# Patient Record
Sex: Female | Born: 1985 | Race: White | Hispanic: No
Health system: Southern US, Community
[De-identification: ages and names within clinical notes are randomized; demographics above are authoritative.]

## PROBLEM LIST (undated history)

## (undated) DIAGNOSIS — O1490 Unspecified pre-eclampsia, unspecified trimester: Secondary | ICD-10-CM

## (undated) DIAGNOSIS — O139 Gestational [pregnancy-induced] hypertension without significant proteinuria, unspecified trimester: Secondary | ICD-10-CM

## (undated) DIAGNOSIS — F329 Major depressive disorder, single episode, unspecified: Secondary | ICD-10-CM

## (undated) DIAGNOSIS — F32A Depression, unspecified: Secondary | ICD-10-CM

## (undated) DIAGNOSIS — F419 Anxiety disorder, unspecified: Secondary | ICD-10-CM

## (undated) DIAGNOSIS — R519 Headache, unspecified: Secondary | ICD-10-CM

## (undated) HISTORY — PX: WISDOM TOOTH EXTRACTION: SHX21

## (undated) HISTORY — PX: GANGLION CYST EXCISION: SHX1691

## (undated) HISTORY — DX: Unspecified pre-eclampsia, unspecified trimester: O14.90

## (undated) HISTORY — DX: Headache, unspecified: R51.9

---
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
## 2010-02-18 ENCOUNTER — Ambulatory Visit: Payer: Self-pay | Admitting: Family Medicine

## 2010-02-18 DIAGNOSIS — F329 Major depressive disorder, single episode, unspecified: Secondary | ICD-10-CM | POA: Insufficient documentation

## 2010-02-18 DIAGNOSIS — N209 Urinary calculus, unspecified: Secondary | ICD-10-CM | POA: Insufficient documentation

## 2010-02-18 DIAGNOSIS — F411 Generalized anxiety disorder: Secondary | ICD-10-CM | POA: Insufficient documentation

## 2010-02-18 LAB — CONVERTED CEMR LAB
Beta hcg, urine, semiquantitative: NEGATIVE
Bilirubin Urine: NEGATIVE
Glucose, Urine, Semiquant: NEGATIVE
Ketones, urine, test strip: NEGATIVE
WBC Urine, dipstick: NEGATIVE

## 2010-10-29 ENCOUNTER — Emergency Department (HOSPITAL_BASED_OUTPATIENT_CLINIC_OR_DEPARTMENT_OTHER)
Admission: EM | Admit: 2010-10-29 | Discharge: 2010-10-29 | Payer: Self-pay | Source: Home / Self Care | Admitting: Emergency Medicine

## 2010-12-12 NOTE — Assessment & Plan Note (Signed)
Summary: Frequent, painful urination x this am rm 3   Vital Signs:  Patient Profile:   25 Years Old Female CC:      Frequent, painful urination - burning x this am LMP:     01/28/2010 Height:     66 inches Weight:      134 pounds O2 Sat:      100 % O2 treatment:    Room Air Pulse rhythm:   regular Resp:     16 per minute (right arm) Cuff size:   regular  Vitals Entered By: Areta Haber CMA (February 18, 2010 10:38 AM)  Menstrual History: LMP (date): 01/28/2010 LMP - Character: normal                  Current Allergies: No known allergies History of Present Illness History from: patient Reason for visit: uti sx today Chief Complaint: Frequent, painful urination - burning x this am History of Present Illness: abrupt onse of left flank pain,couldn't get comfortable,assoc urinary urgency, sl nausea, h/o kidney stone,never documented or followed up, no urologist.  REVIEW OF SYSTEMS Constitutional Symptoms      Denies fever, chills, night sweats, weight loss, weight gain, and fatigue.  Eyes       Denies change in vision, eye pain, eye discharge, glasses, contact lenses, and eye surgery. Ear/Nose/Throat/Mouth       Denies hearing loss/aids, change in hearing, ear pain, ear discharge, dizziness, frequent runny nose, frequent nose bleeds, sinus problems, sore throat, hoarseness, and tooth pain or bleeding.  Respiratory       Denies dry cough, productive cough, wheezing, shortness of breath, asthma, bronchitis, and emphysema/COPD.  Cardiovascular       Denies murmurs, chest pain, and tires easily with exhertion.    Gastrointestinal       Denies stomach pain, nausea/vomiting, diarrhea, constipation, blood in bowel movements, and indigestion. Genitourniary       Complains of painful urination.      Denies kidney stones and loss of urinary control.      Comments: Frequent, burning x this am Neurological       Denies paralysis, seizures, and  fainting/blackouts. Musculoskeletal       Denies muscle pain, joint pain, joint stiffness, decreased range of motion, redness, swelling, muscle weakness, and gout.  Skin       Denies bruising, unusual mles/lumps or sores, and hair/skin or nail changes.  Psych       Denies mood changes, temper/anger issues, anxiety/stress, speech problems, depression, and sleep problems. Other Comments: Pt states that she has Hx of UTIs. pt states she a old prescription of  Macrobid 100mg  1 tab po at night that she has used.   Past History:  h/o uti and possibly kidney stone several yrs ago  Past Medical History: Anxiety Depression  Past Surgical History: Wisdom teeth removed Removal cyst on hand PMH-FH-SH reviewed-no changes except otherwise noted  Family History: Family History Diabetes 1st degree relative Family History Hypertension Family History Lung cancer  Social History: Married Never Smoked Alcohol use-no Drug use-no Regular exercise-yes Smoking Status:  never Does Patient Exercise:  yes Drug Use:  no Physical Exam General appearance: well developed, well nourished, no acute distress Head: normocephalic, atraumatic Eyes: conjunctivae and lids normal Chest/Lungs: no rales, wheezes, or rhonchi bilateral, breath sounds equal without effort Heart: regular rate and  rhythm, no murmur Abdomen: soft bs nl, sl left cva and suprapubic soreness, no masses or guarding Assessment New Problems: STONE, URINARY  CALCULUS,UNSPEC. (ICD-592.9) FAMILY HISTORY DIABETES 1ST DEGREE RELATIVE (ICD-V18.0) DEPRESSION (ICD-311) ANXIETY (ICD-300.00)   Patient Education: Patient and/or caregiver instructed in the following: rest fluids and Tylenol.  Plan New Medications/Changes: TYLOX CAPS 1 q6h as needed pain  #15 fifteen x 0, 02/18/2010, Linna Hoff MD [facility] 0.4MG  1 daily  #7 x 0, 02/18/2010, Quita Skye Dalton Molesworth MD CIPRO 500 MG 1 bid  #10 x 0, 02/18/2010, Linna Hoff MD  New Orders: New  Patient Level III [phone] Planning Comments:   go to er for eval for kidney stone if pain worsens  Follow Up: Follow up on an as needed basis, Follow up with Primary Physician  The patient and/or caregiver has been counseled thoroughly with regard to medications prescribed including dosage, schedule, interactions, rationale for use, and possible side effects and they verbalize understanding.  Diagnoses and expected course of recovery discussed and will return if not improved as expected or if the condition worsens. Patient and/or caregiver verbalized understanding.  Prescriptions: TYLOX CAPS 1 q6h as needed pain  #15 fifteen x 0   Entered and Authorized by:   Linna Hoff MD   Signed by:   Linna Hoff MD on 02/18/2010   Method used:   Print then Give to Patient   RxID:   [id number] YQMVHQ 0.4MG  1 daily  #7 x 0   Entered and Authorized by:   Linna Hoff MD   Signed by:   Linna Hoff MD on 02/18/2010   Method used:   Print then Give to Patient   RxID:   [id number] CIPRO 500 MG 1 bid  #10 x 0   Entered and Authorized by:   Linna Hoff MD   Signed by:   Linna Hoff MD on 02/18/2010   Method used:   Print then Give to Patient   RxID:   [id number]   Patient Instructions: 1)  use medicine as prescribed and push fluids today , see your doctor on mon for ct scan or urology referral for further eval or go to ER if pain worsens for eval for kiney stone.  Laboratory Results   Urine Tests  Date/Time Received: February 18, 2010 10:55 AM  Date/Time Reported: February 18, 2010 10:55 AM   Routine Urinalysis   Color: lt. yellow Appearance: Clear Glucose: negative   (Normal Range: Negative) Bilirubin: negative   (Normal Range: Negative) Ketone: negative   (Normal Range: Negative) Spec. Gravity: <1.005   (Normal Range: 1.003-1.035) Blood: moderate   (Normal Range: Negative) pH: 6.5   (Normal Range: 5.0-8.0) Protein: negative   (Normal Range: Negative) Urobilinogen: 0.2    (Normal Range: 0-1) Nitrite: negative   (Normal Range: Negative) Leukocyte Esterace: negative   (Normal Range: Negative)    Urine HCG: negative

## 2011-01-22 LAB — URINALYSIS, ROUTINE W REFLEX MICROSCOPIC
Leukocytes, UA: NEGATIVE
Protein, ur: NEGATIVE mg/dL
Urobilinogen, UA: 0.2 mg/dL (ref 0.0–1.0)

## 2011-01-22 LAB — PREGNANCY, URINE: Preg Test, Ur: NEGATIVE

## 2011-01-22 LAB — URINE MICROSCOPIC-ADD ON

## 2012-08-13 ENCOUNTER — Emergency Department
Admission: EM | Admit: 2012-08-13 | Discharge: 2012-08-13 | Disposition: A | Discharge status: Home / Self Care | Payer: BC Managed Care – PPO | Source: Home / Self Care | Attending: Family Medicine | Admitting: Family Medicine

## 2012-08-13 ENCOUNTER — Encounter: Payer: Self-pay | Admitting: *Deleted

## 2012-08-13 DIAGNOSIS — J029 Acute pharyngitis, unspecified: Secondary | ICD-10-CM

## 2012-08-13 HISTORY — DX: Anxiety disorder, unspecified: F41.9

## 2012-08-13 LAB — POCT RAPID STREP A (OFFICE): Rapid Strep A Screen: NEGATIVE

## 2012-08-13 NOTE — ED Provider Notes (Signed)
History     CSN: [id number]  Arrival date & time 08/13/12  1807   First MD Initiated Contact with Patient 08/13/12 1823      Chief Complaint  Patient presents with  . Sore Throat     HPI Comments: Patient complains of onset of a sore throat yesterday afternoon, followed by myalgias, headache, and fatigue.  She has developed a slight cough, but has had no more nasal congestion than usual.  She has had some loose stools today.  About 3 to 4 days ago she noticed some vague discomfort in her upper back beneath shoulder blades.  The history is provided by the patient.    Past Medical History  Diagnosis Date  . Anxiety     Past Surgical History  Procedure Date  . Wisdom tooth extraction   . Ganglion cyst excision     History reviewed. No pertinent family history.  History  Substance Use Topics  . Smoking status: Never Smoker   . Smokeless tobacco: Not on file  . Alcohol Use: Yes    OB History    Grav Para Term Preterm Abortions TAB SAB Ect Mult Living                  Review of Systems + sore throat + mild cough today No pleuritic pain No wheezing ? nasal congestion No post-nasal drainage No sinus pain/pressure No itchy/red eyes No earache No hemoptysis No SOB No fever/chills No nausea No vomiting No abdominal pain + diarrhea today No urinary symptoms No skin rashes + fatigue + myalgias + headache Used OTC meds without relief (Aleve) Allergies  Review of patient's allergies indicates no known allergies.  Home Medications   Current Outpatient Rx  Name Route Sig Dispense Refill  . ESCITALOPRAM OXALATE 20 MG PO TABS Oral Take 20 mg by mouth daily.      BP 113/79  Pulse 74  Temp 98 F (36.7 C) (Oral)  Resp 16  Ht 5\' 6"  (1.676 m)  Wt 129 lb (58.514 kg)  BMI 20.82 kg/m2  SpO2 99%  LMP 08/09/2012  Physical Exam Nursing notes and Vital Signs reviewed. Appearance:  Patient appears healthy, stated age, and in no acute distress Eyes:  Pupils  are equal, round, and reactive to light and accomodation.  Extraocular movement is intact.  Conjunctivae are not inflamed  Ears:  Canals normal.  Tympanic membranes normal.  Nose:  Mildly congested turbinates.  No sinus tenderness.    Pharynx:  Tonsils are large, and mildly erythematous without exudate Neck:  Supple.  Tender shotty anterior/posterior nodes are palpated bilaterally  Lungs:  Clear to auscultation.  Breath sounds are equal.  Heart:  Regular rate and rhythm without murmurs, rubs, or gallops.  Abdomen:  Mild tenderness over spleen  without masses or hepatosplenomegaly.  Bowel sounds are present.  No CVA or flank tenderness.  Extremities:  No edema.  No calf tenderness Skin:  No rash present.   ED Course  Procedures  none   Labs Reviewed  POCT RAPID STREP A (OFFICE) negative  STREP A DNA PROBE pending      1. Pharyngitis; suspect early viral URI      MDM  There is no evidence of bacterial infection today.   Throat culture pending Treat symptomatically for now  For sore throat, take Ibuprofen 200mg , 4 tabs every 8 hours with food.  If increasing cold-like symptoms develop, take Mucinex D (guaifenesin with decongestant) twice daily for congestion.  Increase fluid  intake, rest. May use Afrin nasal spray (or generic oxymetazoline) twice daily for about 5 days.  Also recommend using saline nasal spray several times daily and saline nasal irrigation (AYR is a common brand) Stop all antihistamines for now, and other non-prescription cough/cold preparations. May take Delsym Cough Suppressant at bedtime for nighttime cough.  Follow-up with family doctor if not improving 7 to 10 days.         Lattie Haw, MD 08/13/12 [phone]

## 2012-08-13 NOTE — ED Notes (Signed)
Pt c/o sore throat, HA, and a little diarrhea x 1 day. Denies fever. She has taken Aleve.

## 2012-08-15 ENCOUNTER — Telehealth: Payer: Self-pay

## 2012-08-15 NOTE — ED Notes (Signed)
Left a message on voice mail asking how patient is feeling and advising to call back with any questions or concerns.  

## 2012-08-19 ENCOUNTER — Emergency Department
Admission: EM | Admit: 2012-08-19 | Discharge: 2012-08-19 | Disposition: A | Discharge status: Home / Self Care | Payer: BC Managed Care – PPO | Source: Home / Self Care | Attending: Family Medicine | Admitting: Family Medicine

## 2012-08-19 DIAGNOSIS — J029 Acute pharyngitis, unspecified: Secondary | ICD-10-CM

## 2012-08-19 DIAGNOSIS — F419 Anxiety disorder, unspecified: Secondary | ICD-10-CM | POA: Insufficient documentation

## 2012-08-19 DIAGNOSIS — J020 Streptococcal pharyngitis: Secondary | ICD-10-CM

## 2012-08-19 MED ORDER — PENICILLIN V POTASSIUM 500 MG PO TABS: ORAL_TABLET | ORAL | Status: DC

## 2012-08-19 NOTE — ED Notes (Signed)
Davis complains of a sore throat and dry cough for 1 week. She also has fever, chills and sweats. Her sore throat has been worse the last few days along with body aches.

## 2012-08-19 NOTE — ED Provider Notes (Signed)
History     CSN: [id number]  Arrival date & time 08/19/12  7829   First MD Initiated Contact with Patient 08/19/12 1955      Chief Complaint  Patient presents with  . Sore Throat    x 1 week      HPI Comments: The patient reports that her URI symptoms were improving until 2 to 3 days ago when she developed myalgias, fever to 102, and increased sore throat.  The history is provided by the patient.    Past Medical History  Diagnosis Date  . Anxiety   . Anxiety     Past Surgical History  Procedure Date  . Wisdom tooth extraction   . Ganglion cyst excision     History reviewed. No pertinent family history.  History  Substance Use Topics  . Smoking status: Never Smoker   . Smokeless tobacco: Never Used  . Alcohol Use: Yes    OB History    Grav Para Term Preterm Abortions TAB SAB Ect Mult Living                  Review of Systems + sore throat + cough, improved No pleuritic pain No wheezing + nasal congestion + post-nasal drainage No sinus pain/pressure No itchy/red eyes ? earache No hemoptysis No SOB + fever, + chills No nausea No vomiting No abdominal pain No diarrhea No urinary symptoms No skin rashes + fatigue + myalgias + headache   Allergies  Review of patient's allergies indicates no known allergies.  Home Medications   Current Outpatient Rx  Name Route Sig Dispense Refill  . ESCITALOPRAM OXALATE 20 MG PO TABS Oral Take 20 mg by mouth daily.    Marland Kitchen PENICILLIN V POTASSIUM 500 MG PO TABS  Take one tab by mouth twice daily for 10 days 20 tablet 0    BP 122/81  Pulse 94  Temp 98.2 F (36.8 C) (Oral)  Resp 18  Ht 5\' 4"  (1.626 m)  Wt 133 lb (60.328 kg)  BMI 22.83 kg/m2  SpO2 97%  LMP 08/09/2012  Physical Exam Nursing notes and Vital Signs reviewed. Appearance:  Patient appears healthy, stated age, and in no acute distress Eyes:  Pupils are equal, round, and reactive to light and accomodation.  Extraocular movement is intact.   Conjunctivae are not inflamed  Ears:  Canals normal.  Tympanic membranes normal.  Nose:  Mildly congested turbinates.  No sinus tenderness.    Pharynx:  Erythematous, no exudate Neck:  Supple.  Tender shotty anterior nodes are palpated bilaterally  Lungs:  Clear to auscultation.  Breath sounds are equal.  Heart:  Regular rate and rhythm without murmurs, rubs, or gallops.  Abdomen:  Nontender without masses or hepatosplenomegaly.  Bowel sounds are present.  No CVA or flank tenderness.  Extremities:  No edema.  No calf tenderness Skin:  No rash present.   ED Course  Procedures none  Labs Reviewed  POCT RAPID STREP A (OFFICE) - Abnormal; Notable for the following:    Rapid Strep A Screen Positive (*)     All other components within normal limits      1. Sore throat   2. Streptococcal sore throat       MDM  Begin PenVK Begin Ibuprofen 200mg , 4 tabs every 8 hours with food.  Warm saline gargles. Followup with Family Doctor if not improved in one week.         Lattie Haw, MD 08/20/12 [phone]

## 2013-07-15 ENCOUNTER — Encounter (HOSPITAL_BASED_OUTPATIENT_CLINIC_OR_DEPARTMENT_OTHER): Payer: Self-pay | Admitting: *Deleted

## 2013-07-15 ENCOUNTER — Emergency Department (HOSPITAL_BASED_OUTPATIENT_CLINIC_OR_DEPARTMENT_OTHER)
Admission: EM | Admit: 2013-07-15 | Discharge: 2013-07-15 | Disposition: A | Discharge status: Home / Self Care | Payer: BC Managed Care – PPO | Attending: Emergency Medicine | Admitting: Emergency Medicine

## 2013-07-15 ENCOUNTER — Emergency Department (HOSPITAL_BASED_OUTPATIENT_CLINIC_OR_DEPARTMENT_OTHER): Payer: BC Managed Care – PPO

## 2013-07-15 DIAGNOSIS — F411 Generalized anxiety disorder: Secondary | ICD-10-CM | POA: Insufficient documentation

## 2013-07-15 DIAGNOSIS — R109 Unspecified abdominal pain: Secondary | ICD-10-CM | POA: Insufficient documentation

## 2013-07-15 DIAGNOSIS — Z87442 Personal history of urinary calculi: Secondary | ICD-10-CM | POA: Insufficient documentation

## 2013-07-15 DIAGNOSIS — M545 Low back pain, unspecified: Secondary | ICD-10-CM | POA: Insufficient documentation

## 2013-07-15 DIAGNOSIS — Z3202 Encounter for pregnancy test, result negative: Secondary | ICD-10-CM | POA: Insufficient documentation

## 2013-07-15 DIAGNOSIS — Z79899 Other long term (current) drug therapy: Secondary | ICD-10-CM | POA: Insufficient documentation

## 2013-07-15 LAB — URINALYSIS, ROUTINE W REFLEX MICROSCOPIC
Bilirubin Urine: NEGATIVE
Specific Gravity, Urine: 1.008 (ref 1.005–1.030)
pH: 6 (ref 5.0–8.0)

## 2013-07-15 LAB — URINE MICROSCOPIC-ADD ON

## 2013-07-15 LAB — PREGNANCY, URINE: Preg Test, Ur: NEGATIVE

## 2013-07-15 MED ORDER — CYCLOBENZAPRINE HCL 10 MG PO TABS: 10.0000 mg | ORAL_TABLET | Freq: Three times a day (TID) | ORAL | Status: DC | PRN

## 2013-07-15 NOTE — ED Provider Notes (Signed)
CSN: [id number]     Arrival date & time 07/15/13  4098 History   First MD Initiated Contact with Patient 07/15/13 [phone]     Chief Complaint  Patient presents with  . Flank Pain   (Consider location/radiation/quality/duration/timing/severity/associated sxs/prior Treatment) HPI Comments: Patient is a 27 year old female with past medical history significant for kidney stones. She presents with complaints of pain in the bilateral flanks and lower back which she states began last night. She denies any injury or trauma. She denies any blood in the urine and denies any pain with urination. There no fevers or chills. She says that this feels somewhat similar to previous stone.  Patient is a 27 y.o. female presenting with flank pain. The history is provided by the patient.  Flank Pain This is a new problem. The current episode started yesterday. The problem occurs constantly. The problem has not changed since onset.Pertinent negatives include no abdominal pain. Nothing aggravates the symptoms. Nothing relieves the symptoms. She has tried nothing for the symptoms. The treatment provided no relief.    Past Medical History  Diagnosis Date  . Anxiety   . Anxiety    Past Surgical History  Procedure Laterality Date  . Wisdom tooth extraction    . Ganglion cyst excision     History reviewed. No pertinent family history. History  Substance Use Topics  . Smoking status: Never Smoker   . Smokeless tobacco: Never Used  . Alcohol Use: Yes   OB History   Grav Para Term Preterm Abortions TAB SAB Ect Mult Living                 Review of Systems  Gastrointestinal: Negative for abdominal pain.  Genitourinary: Positive for flank pain.  All other systems reviewed and are negative.    Allergies  Review of patient's allergies indicates no known allergies.  Home Medications   Current Outpatient Rx  Name  Route  Sig  Dispense  Refill  . escitalopram (LEXAPRO) 20 MG tablet   Oral   Take 20 mg by  mouth daily.         . penicillin v potassium (VEETID) 500 MG tablet      Take one tab by mouth twice daily for 10 days   20 tablet   0    BP 125/70  Pulse 90  Temp(Src) 97.9 F (36.6 C) (Oral)  Ht 5\' 6"  (1.676 m)  Wt 127 lb (57.607 kg)  BMI 20.51 kg/m2  SpO2 100%  LMP 07/13/2013 Physical Exam  Nursing note and vitals reviewed. Constitutional: She is oriented to person, place, and time. She appears well-developed and well-nourished. No distress.  HENT:  Head: Normocephalic and atraumatic.  Neck: Normal range of motion. Neck supple.  Cardiovascular: Normal rate and regular rhythm.  Exam reveals no gallop and no friction rub.   No murmur heard. Pulmonary/Chest: Effort normal and breath sounds normal. No respiratory distress. She has no wheezes.  Abdominal: Soft. Bowel sounds are normal. She exhibits no distension. There is no tenderness.  There is mild bilateral CVA tenderness along with mild suprapubic tenderness.  Musculoskeletal: Normal range of motion.  Neurological: She is alert and oriented to person, place, and time.  Skin: Skin is warm and dry. She is not diaphoretic.    ED Course  Procedures (including critical care time) Labs Review Labs Reviewed  URINALYSIS, ROUTINE W REFLEX MICROSCOPIC  PREGNANCY, URINE   Imaging Review No results found.  MDM  No diagnosis found. Patient presents here  with complaints of pain in the low back and bilateral flanks it started yesterday. Workup reveals no evidence of a kidney stone. CT scan reveals no alternate pathology. There is no evidence for urinary tract infection. The absence of other pathology leads me to believe that this is musculoskeletal in nature. We'll treat with NSAIDs and muscle relaxers. To return or followup when necessary if not improving or if symptoms worsen.    Geoffery Lyons, MD 07/15/13 [phone]

## 2013-07-15 NOTE — ED Notes (Signed)
Flank pain onset yesterday more severe last pm states is having menstrual cramps but this feels different states it feels like "kidney pain"

## 2014-03-02 ENCOUNTER — Emergency Department
Admission: EM | Admit: 2014-03-02 | Discharge: 2014-03-02 | Disposition: A | Discharge status: Home / Self Care | Payer: BC Managed Care – PPO | Source: Home / Self Care | Attending: Emergency Medicine | Admitting: Emergency Medicine

## 2014-03-02 ENCOUNTER — Encounter: Payer: Self-pay | Admitting: Emergency Medicine

## 2014-03-02 DIAGNOSIS — J209 Acute bronchitis, unspecified: Secondary | ICD-10-CM

## 2014-03-02 LAB — POCT INFLUENZA A/B
Influenza A, POC: NEGATIVE
Influenza B, POC: NEGATIVE

## 2014-03-02 MED ORDER — AZITHROMYCIN 250 MG PO TABS: ORAL_TABLET | ORAL | Status: DC

## 2014-03-02 MED ORDER — PROMETHAZINE-CODEINE 6.25-10 MG/5ML PO SYRP: ORAL_SOLUTION | ORAL | Status: DC

## 2014-03-02 NOTE — ED Provider Notes (Signed)
CSN: [id number]     Arrival date & time 03/02/14  [id number] History   First MD Initiated Contact with Patient 03/02/14 0957     Chief Complaint  Patient presents with  . Cough  . Generalized Body Aches    HPI FLU/URI  HPI : Flu symptoms for about 1 day. Fever to 100.5 with chills, sweats, myalgias, fatigue. + Hacking nonproductive cough. Her chest hurts when she coughs . No headache. Symptoms are progressively worsening, despite trying OTC fever reducing medicine and rest and fluids. Has decreased appetite, but tolerating some liquids by mouth. No history of recent tick bite. 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 She also recently [place] a [place], exposed to some sick people on the flight.  Review of Systems: Positive for fatigue, mild nasal congestion, minimal sore throat, mild swollen anterior neck glands. Negative for acute vision changes, stiff neck, focal weakness, syncope, seizures, respiratory distress, vomiting, diarrhea, GU symptoms, new Rash. No exertional chest pain or shortness of breath  Past Medical History  Diagnosis Date  . Anxiety   . Anxiety    Past Surgical History  Procedure Laterality Date  . Wisdom tooth extraction    . Ganglion cyst excision     History reviewed. No pertinent family history. History  Substance Use Topics  . Smoking status: Never Smoker   . Smokeless tobacco: Never Used  . Alcohol Use: Yes   OB History   Grav Para Term Preterm Abortions TAB SAB Ect Mult Living                 Review of Systems  All other systems reviewed and are negative.   Allergies  Review of patient's allergies indicates no known allergies.  Home Medications   Prior to Admission medications   Medication Sig Start Date End Date Taking? Authorizing Provider  cyclobenzaprine (FLEXERIL) 10 MG tablet Take 1 tablet (10 mg total) by mouth 3 (three) times daily as needed for muscle spasms. 07/15/13   Geoffery Lyonsouglas Delo, MD   escitalopram (LEXAPRO) 20 MG tablet Take 20 mg by mouth daily.    Historical Provider, MD  penicillin v potassium (VEETID) 500 MG tablet Take one tab by mouth twice daily for 10 days 08/19/12   Lattie HawStephen A Beese, MD   BP 126/86  Pulse 107  Temp(Src) 99 F (37.2 C) (Oral)  Resp 16  Ht 5\' 5"  (1.651 m)  Wt 132 lb (59.875 kg)  BMI 21.97 kg/m2  SpO2 98%  LMP 03/01/2014 Physical Exam  Nursing note and vitals reviewed. Constitutional: She appears well-developed and well-nourished.  Non-toxic appearance. She appears ill (very fatigued, but no cardiorespiratory distress). No distress.  HENT:  Head: Normocephalic and atraumatic.  Right Ear: Tympanic membrane and external ear normal.  Left Ear: Tympanic membrane and external ear normal.  Nose: Rhinorrhea present.  Mouth/Throat: Mucous membranes are normal. Posterior oropharyngeal erythema (Minimal injection, no erythema or tonsillar enlargement or exudate) present. No oropharyngeal exudate.  Eyes: Conjunctivae are normal. Right eye exhibits no discharge. Left eye exhibits no discharge. No scleral icterus.  Neck: Neck supple.  Cardiovascular: Normal rate, regular rhythm and normal heart sounds.   Pulmonary/Chest: No accessory muscle usage or stridor. No respiratory distress. She has no decreased breath sounds. She has no wheezes. She has rhonchi (A few anterior rhonchi). She has no rales.  Abdominal: Soft. There is no tenderness.  Musculoskeletal: She exhibits no edema.  Lymphadenopathy:    She  has cervical adenopathy (mild shoddy anterior cervical nodes).  Neurological: She is alert.  Skin: Skin is warm and intact. No rash noted. She is not diaphoretic.  Psychiatric: She has a normal mood and affect.    ED Course  Procedures (including critical care time) Labs Review Labs Reviewed  POCT INFLUENZA A/B    Imaging Review No results found.   MDM   1. Acute bronchitis    Rapid flu tests negative. Treatment options discussed, as well  as risks, benefits, alternatives. Patient voiced understanding and agreement with the following plans: Z-Pak for bacterial and atypical coverage. Other OTC symptomatic care discussed.--Delsym cough syrup Phenergan with codeine one or 2 teaspoons at bedtime when necessary severe cough.  Follow-up with your primary care doctor in 5-7 days if not improving, or sooner if symptoms become worse. Precautions discussed. Red flags discussed. Questions invited and answered. Patient voiced understanding and agreement.      Lajean Manesavid Massey, MD 03/02/14 1106

## 2014-03-02 NOTE — ED Notes (Signed)
Pt c/o body aches, cough, and chest hurts when she coughs x 1 day.

## 2014-06-23 ENCOUNTER — Emergency Department (INDEPENDENT_AMBULATORY_CARE_PROVIDER_SITE_OTHER): Payer: BC Managed Care – PPO

## 2014-06-23 ENCOUNTER — Encounter: Payer: Self-pay | Admitting: Emergency Medicine

## 2014-06-23 ENCOUNTER — Emergency Department (INDEPENDENT_AMBULATORY_CARE_PROVIDER_SITE_OTHER)
Admission: EM | Admit: 2014-06-23 | Discharge: 2014-06-23 | Disposition: A | Discharge status: Home / Self Care | Payer: BC Managed Care – PPO | Source: Home / Self Care | Attending: Family Medicine | Admitting: Family Medicine

## 2014-06-23 DIAGNOSIS — IMO0001 Reserved for inherently not codable concepts without codable children: Secondary | ICD-10-CM

## 2014-06-23 DIAGNOSIS — M7520 Bicipital tendinitis, unspecified shoulder: Secondary | ICD-10-CM

## 2014-06-23 DIAGNOSIS — M7521 Bicipital tendinitis, right shoulder: Secondary | ICD-10-CM

## 2014-06-23 DIAGNOSIS — M7918 Myalgia, other site: Secondary | ICD-10-CM

## 2014-06-23 DIAGNOSIS — M542 Cervicalgia: Secondary | ICD-10-CM

## 2014-06-23 MED ORDER — HYDROCODONE-ACETAMINOPHEN 5-325 MG PO TABS: ORAL_TABLET | ORAL | Status: DC

## 2014-06-23 MED ORDER — PREDNISONE 20 MG PO TABS: 20.0000 mg | ORAL_TABLET | Freq: Two times a day (BID) | ORAL | Status: DC

## 2014-06-23 NOTE — ED Provider Notes (Signed)
CSN: [id number]     Arrival date & time 06/23/14  1525 History   First MD Initiated Contact with Patient 06/23/14 1554     Chief Complaint  Patient presents with  . Neck Pain      HPI Comments: Patient reports that she was digging in a garden about 2 months ago and subsequently developed pain in her right neck and shoulder that has persisted.  She occasionally has a tingling sensation radiating into her right arm.  Over the past two days she has not been able to sleep well because of the pain.  She has had a poor response from Aleve, Excedrin, and ice/heat application.  Patient is a 28 y.o. female presenting with shoulder pain. The history is provided by the patient.  Shoulder Pain This is a new problem. Episode onset: 2 months ago. The problem occurs daily. The problem has been gradually worsening. Associated symptoms comments: Right neck pain . Exacerbated by: movement of right arm and shoulder. Treatments tried: NSAID's and ice/heat. The treatment provided mild relief.    Past Medical History  Diagnosis Date  . Anxiety   . Anxiety    Past Surgical History  Procedure Laterality Date  . Wisdom tooth extraction    . Ganglion cyst excision     No family history on file. History  Substance Use Topics  . Smoking status: Never Smoker   . Smokeless tobacco: Never Used  . Alcohol Use: Yes   OB History   Grav Para Term Preterm Abortions TAB SAB Ect Mult Living                 Review of Systems  All other systems reviewed and are negative.   Allergies  Review of patient's allergies indicates no known allergies.  Home Medications   Prior to Admission medications   Medication Sig Start Date End Date Taking? Authorizing Provider  buPROPion (WELLBUTRIN SR) 200 MG 12 hr tablet Take 200 mg by mouth 2 (two) times daily.   Yes Historical Provider, MD  escitalopram (LEXAPRO) 20 MG tablet Take 20 mg by mouth daily.    Historical Provider, MD  HYDROcodone-acetaminophen (NORCO/VICODIN)  5-325 MG per tablet Take one by mouth at bedtime as needed for pain 06/23/14   Lattie Haw, MD  predniSONE (DELTASONE) 20 MG tablet Take 1 tablet (20 mg total) by mouth 2 (two) times daily. Take with food. 06/23/14   Lattie Haw, MD   BP 142/97  Pulse 85  Temp(Src) 98.7 F (37.1 C) (Oral)  Ht 5\' 5"  (1.651 m)  Wt 129 lb (58.514 kg)  BMI 21.47 kg/m2  SpO2 100%  LMP 06/04/2014 Physical Exam  Nursing note and vitals reviewed. Constitutional: She is oriented to person, place, and time. She appears well-developed and well-nourished. No distress.  HENT:  Head: Atraumatic.  Eyes: Conjunctivae are normal. Pupils are equal, round, and reactive to light.  Cardiovascular: Normal heart sounds.   Pulmonary/Chest: Breath sounds normal.  Musculoskeletal:       Right shoulder: She exhibits tenderness. She exhibits normal range of motion, no bony tenderness, no swelling, no crepitus and no deformity.       Cervical back: She exhibits tenderness. She exhibits normal range of motion, no bony tenderness and no swelling.  Right shoulder has normal range of motion, and normal exam other than mild tenderness over long head of biceps.  Right neck has tenderness over the trapezius and sternocleidomastoid muscles.  There is distinct tenderness over medial and  inferior edges of right scapula.  Pain elicited by resisted abduction of right shoulder while palpating right rhomboid muscles.    Lymphadenopathy:    She has no cervical adenopathy.  Neurological: She is alert and oriented to person, place, and time.  Skin: Skin is warm and dry. No rash noted.    ED Course  Procedures  none     Imaging Review Dg Cervical Spine Complete  06/23/2014   CLINICAL DATA:  Right neck pain.  EXAM: CERVICAL SPINE  4+ VIEWS  COMPARISON:  None.  FINDINGS: There is no evidence of cervical spine fracture or prevertebral soft tissue swelling. Alignment is normal. No other significant bone abnormalities are identified.   IMPRESSION: Negative cervical spine radiographs.   Electronically Signed   By: Charlett NoseKevin  Dover M.D.   On: 06/23/2014 16:34     MDM   1. Rhomboid muscle pain   2. Biceps tendonitis on right    Begin short prednisone burst.  Lortab for pain at night.  Begin range of motion and stretching exercises as per instruction sheets ([facility] information and instruction handouts given).  Followup with Dr. Rodney Langtonhomas Thekkekandam ([facility]) if not improving about two weeks.      Lattie HawStephen A Beese, MD 06/27/14 [phone]

## 2014-06-23 NOTE — ED Notes (Signed)
Rt side neck pain since end of June, noticed pain in right shoulder and neck after working in yard pain radiates down right arm. Has been going to a massage therapist but only gets temporary relief. Headaches, can't sleep 6/10 constant

## 2014-06-27 NOTE — Discharge Instructions (Signed)
Apply ice pack two or three times daily for about 20 minutes.  Avoid repetitive motions and lifting with right arm/shoulder.  Begin range of motion and stretching exercises as per instruction sheets.

## 2016-01-13 ENCOUNTER — Encounter: Payer: Self-pay | Admitting: Emergency Medicine

## 2016-01-13 ENCOUNTER — Emergency Department
Admission: EM | Admit: 2016-01-13 | Discharge: 2016-01-13 | Disposition: A | Discharge status: Home / Self Care | Payer: BLUE CROSS/BLUE SHIELD | Source: Home / Self Care | Attending: Family Medicine | Admitting: Family Medicine

## 2016-01-13 DIAGNOSIS — R69 Illness, unspecified: Principal | ICD-10-CM

## 2016-01-13 DIAGNOSIS — J111 Influenza due to unidentified influenza virus with other respiratory manifestations: Secondary | ICD-10-CM

## 2016-01-13 MED ORDER — BENZONATATE 200 MG PO CAPS: 200.0000 mg | ORAL_CAPSULE | Freq: Every day | ORAL | Status: DC

## 2016-01-13 MED ORDER — OSELTAMIVIR PHOSPHATE 75 MG PO CAPS: 75.0000 mg | ORAL_CAPSULE | Freq: Two times a day (BID) | ORAL | Status: DC

## 2016-01-13 NOTE — Discharge Instructions (Signed)
Take plain guaifenesin (1200mg  extended release tabs such as Mucinex) twice daily, with plenty of water, for cough and congestion.  May add Pseudoephedrine (30mg , one or two every 4 to 6 hours) for sinus congestion.  Get adequate rest.   May use Afrin nasal spray (or generic oxymetazoline) twice daily for about 5 days and then discontinue.  Also recommend using saline nasal spray several times daily and saline nasal irrigation (AYR is a common brand).   Try warm salt water gargles for sore throat.  Stop all antihistamines for now, and other non-prescription cough/cold preparations. May take Ibuprofen 200mg , 4 tabs every 8 hours with food for body aches, headache, fever, etc.

## 2016-01-13 NOTE — ED Notes (Signed)
Fever, chills, body aches, cough, chest hurts started last night

## 2016-01-13 NOTE — ED Provider Notes (Signed)
CSN: [id number]     Arrival date & time 01/13/16  1446 History   First MD Initiated Contact with Patient 01/13/16 1521     Chief Complaint  Patient presents with  . Influenza      HPI Comments: Last night patient developed flu-like illness including myalgias, headache, fever/chills, fatigue, and cough.  Also has mild nasal congestion and sore throat.  Cough is non-productive.  No pleuritic pain or shortness of breath.    The history is provided by the patient.    Past Medical History  Diagnosis Date  . Anxiety   . Anxiety    Past Surgical History  Procedure Laterality Date  . Wisdom tooth extraction    . Ganglion cyst excision     No family history on file. Social History  Substance Use Topics  . Smoking status: Never Smoker   . Smokeless tobacco: Never Used  . Alcohol Use: Yes   OB History    No data available     Review of Systems + sore throat + cough No pleuritic pain No wheezing + nasal congestion + post-nasal drainage No sinus pain/pressure No itchy/red eyes No earache + dizzy No hemoptysis No SOB No fever, + chills No nausea No vomiting No abdominal pain No diarrhea No urinary symptoms No skin rash + fatigue + myalgias + headache Used OTC meds without relief  Allergies  Review of patient's allergies indicates no known allergies.  Home Medications   Prior to Admission medications   Medication Sig Start Date End Date Taking? Authorizing Provider  Nutritional Supplements (COLD AND FLU PO) Take by mouth.   Yes Historical Provider, MD  benzonatate (TESSALON) 200 MG capsule Take 1 capsule (200 mg total) by mouth at bedtime. Take as needed for cough 01/13/16   Lattie HawStephen A Kedra Mcglade, MD  buPROPion [facility](WELLBUTRIN SR) 200 MG 12 hr tablet Take 200 mg by mouth 2 (two) times daily.    Historical Provider, MD  escitalopram (LEXAPRO) 20 MG tablet Take 20 mg by mouth daily.    Historical Provider, MD  oseltamivir (TAMIFLU) 75 MG capsule Take 1 capsule (75 mg total) by  mouth every 12 (twelve) hours. 01/13/16   Lattie HawStephen A Jourdyn Ferrin, MD   Meds Ordered and Administered this Visit  Medications - No data to display  BP 107/76 mmHg  Pulse 127  Temp(Src) 98.2 F (36.8 C) (Oral)  Ht 5\' 5"  (1.651 m)  Wt 147 lb (66.679 kg)  BMI 24.46 kg/m2  SpO2 97% No data found.   Physical Exam Nursing notes and Vital Signs reviewed. Appearance:  Patient appears stated age, and in no acute distress Eyes:  Pupils are equal, round, and reactive to light and accomodation.  Extraocular movement is intact.  Conjunctivae are not inflamed  Ears:  Canals normal.  Tympanic membranes normal.  Nose:  Mildly congested turbinates.  No sinus tenderness.   Pharynx:  Normal Neck:  Supple.  Tender enlarged posterior nodes are palpated bilaterally  Lungs:  Clear to auscultation.  Breath sounds are equal.  Moving air well. Heart:  Regular rate and rhythm without murmurs, rubs, or gallops.  Abdomen:  Nontender without masses or hepatosplenomegaly.  Bowel sounds are present.  No CVA or flank tenderness.  Extremities:  No edema.  Skin:  No rash present.   ED Course  Procedures none    MDM   1. Influenza-like illness    Begin TamifluTake plain guaifenesin (1200mg  extended release tabs such as Mucinex) twice daily, with plenty of water,  for cough and congestion.  May add Pseudoephedrine ( , one or two every 4 to 6 hours) for sinus congestion.  Get adequate rest.   May use Afrin nasal spray (or generic oxymetazoline) twice daily for about 5 days and then discontinue.  Also recommend using saline nasal spray several times daily and saline nasal irrigation (AYR is a common brand).   Try warm salt water gargles for sore throat.  Stop all antihistamines for now, and other non-prescription cough/cold preparations. May take Ibuprofen , 4 tabs every 8 hours with food for body aches, headache, fever, etc.     Lattie Haw, MD 01/16/16 [phone]

## 2016-03-19 ENCOUNTER — Encounter: Payer: Self-pay | Admitting: *Deleted

## 2016-03-19 ENCOUNTER — Emergency Department
Admission: EM | Admit: 2016-03-19 | Discharge: 2016-03-19 | Disposition: A | Discharge status: Home / Self Care | Payer: BLUE CROSS/BLUE SHIELD | Source: Home / Self Care | Attending: Family Medicine | Admitting: Family Medicine

## 2016-03-19 DIAGNOSIS — M545 Low back pain, unspecified: Secondary | ICD-10-CM

## 2016-03-19 MED ORDER — MELOXICAM 7.5 MG PO TABS: 7.5000 mg | ORAL_TABLET | Freq: Every day | ORAL | Status: DC

## 2016-03-19 MED ORDER — PREDNISONE 20 MG PO TABS
ORAL_TABLET | ORAL | Status: DC
Start: 2016-03-19 — End: 2016-05-08

## 2016-03-19 MED ORDER — HYDROCODONE-ACETAMINOPHEN 5-325 MG PO TABS: 1.0000 | ORAL_TABLET | Freq: Four times a day (QID) | ORAL | Status: DC | PRN

## 2016-03-19 NOTE — Discharge Instructions (Signed)
Norco/Vicodin (hydrocodone-acetaminophen) is a narcotic pain medication, do not combine these medications with others containing tylenol. While taking, do not drink alcohol, drive, or perform any other activities that requires focus while taking these medications.  ° °Meloxicam (Mobic) is an antiinflammatory to help with pain and inflammation.  Do not take ibuprofen, Advil, Aleve, or any other medications that contain NSAIDs while taking meloxicam as this may cause stomach upset or even ulcers if taken in large amounts for an extended period of time.  ° ° °

## 2016-03-19 NOTE — ED Provider Notes (Signed)
CSN: [id number]     Arrival date & time 03/19/16  1800 History   First MD Initiated Contact with Patient 03/19/16 1821     Chief Complaint  Patient presents with  . Back Pain   (Consider location/radiation/quality/duration/timing/severity/associated sxs/prior Treatment) HPI The pt is a 30yo [facility]/o lower back pain that has been intermittent for about 4 years but has worsened over the last 2 weeks. 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  Pain is aching and sore, worse with prolonged sitting, prolonged standing or going from sitting to standing. Denies numbness or tingling in arms or legs. No change in bowel or bladder habits. She has ben taking Ibuprofen, Tylenol and Excedrin but no relief. No hx of back surgeries.   Past Medical History  Diagnosis Date  . Anxiety   . Anxiety    Past Surgical History  Procedure Laterality Date  . Wisdom tooth extraction    . Ganglion cyst excision     History reviewed. No pertinent family history. Social History  Substance Use Topics  . Smoking status: Never Smoker   . Smokeless tobacco: Never Used  . Alcohol Use: Yes   OB History    No data available     Review of Systems  Constitutional: Negative for fever and chills.  Genitourinary: Negative for dysuria, urgency, frequency and flank pain.  Musculoskeletal: Positive for myalgias and back pain. Negative for joint swelling, arthralgias, gait problem, neck pain and neck stiffness.  Skin: Negative for color change and rash.  Neurological: Negative for weakness and numbness.    Allergies  Review of patient's allergies indicates no known allergies.  Home Medications   Prior to Admission medications   Medication Sig Start Date End Date Taking? Authorizing Provider  buPROPion (WELLBUTRIN SR) 200 MG 12 hr tablet Take 200 mg by mouth 2 (two) times daily.    Historical Provider, MD   escitalopram (LEXAPRO) 20 MG tablet Take 20 mg by mouth daily.    Historical Provider, MD  HYDROcodone-acetaminophen (NORCO/VICODIN) 5-325 MG tablet Take 1 tablet by mouth every 6 (six) hours as needed for moderate pain or severe pain. 03/19/16   Junius FinnerErin O'Malley, PA-C  meloxicam (MOBIC) 7.5 MG tablet Take 1 tablet (7.5 mg total) by mouth daily. For 1 week, then daily as needed for pain 03/19/16   Junius FinnerErin O'Malley, PA-C  Nutritional Supplements (COLD AND FLU PO) Take by mouth.    Historical Provider, MD  predniSONE (DELTASONE) 20 MG tablet 3 tabs po daily x 3 days, then 2 tabs x 3 days, then 1.5 tabs x 3 days, then 1 tab x 3 days, then 0.5 tabs x 3 days 03/19/16   Junius FinnerErin O'Malley, PA-C   Meds Ordered and Administered this Visit  Medications - No data to display  BP 139/94 mmHg  Pulse 83  Temp(Src) 97.9 F (36.6 C) (Oral)  Resp 16  Ht 5\' 5"  (1.651 m)  Wt 149 lb (67.586 kg)  BMI 24.79 kg/m2  SpO2 97%  LMP 03/11/2016 No data found.   Physical Exam  Constitutional: She is oriented to person, place, and time. She appears well-developed and well-nourished.  HENT:  Head: Normocephalic and atraumatic.  Eyes: EOM are normal.  Neck: Normal range of motion. Neck supple.  No midline bone tenderness, no crepitus or step-offs.   Cardiovascular: Normal rate and intact distal pulses.   Pulmonary/Chest: Effort normal.  Musculoskeletal: Normal range of motion. She exhibits tenderness. She exhibits no edema.  Tenderness to lower lumbar spine over SI joint and surrounding muscles. No crepitus or step-offs.  Full ROM upper and lower extremities with 5/5 strength bilaterally. Negative straight leg raise.   Neurological: She is alert and oriented to person, place, and time.  Skin: Skin is warm and dry. No rash noted. No erythema.  Psychiatric: She has a normal mood and affect. Her behavior is normal.  Nursing note and vitals reviewed.   ED Course  Procedures (including critical care time)  Labs Review Labs  Reviewed - No data to display  Imaging Review No results found.   MDM   1. Bilateral low back pain without sciatica    Pt c/o lower back pain. No known injury. No red flag symptoms. No indication for imaging at this time. Likely lower back strain. Will treat conservatively.    Rx: Norco (8 tabs), prednisone (has had before w/o side effects), and mobic.    F/u with Sports Medicine or her Chiropractor in 1-2 weeks if not improving, sooner if worsening. Patient verbalized understanding and agreement with treatment plan.   Junius Finner, PA-C 03/19/16 1922

## 2016-03-19 NOTE — ED Notes (Signed)
Pt c/o LBP intermittently x 4 years, worse x 2 wks. She has taken IBF, Tylenol and Excedrin at times.

## 2016-05-08 ENCOUNTER — Encounter: Payer: Self-pay | Admitting: *Deleted

## 2016-05-08 ENCOUNTER — Emergency Department
Admission: EM | Admit: 2016-05-08 | Discharge: 2016-05-08 | Disposition: A | Discharge status: Home / Self Care | Payer: BLUE CROSS/BLUE SHIELD | Source: Home / Self Care | Attending: Family Medicine | Admitting: Family Medicine

## 2016-05-08 DIAGNOSIS — R110 Nausea: Secondary | ICD-10-CM

## 2016-05-08 DIAGNOSIS — N910 Primary amenorrhea: Secondary | ICD-10-CM | POA: Diagnosis not present

## 2016-05-08 DIAGNOSIS — H8112 Benign paroxysmal vertigo, left ear: Secondary | ICD-10-CM

## 2016-05-08 DIAGNOSIS — N926 Irregular menstruation, unspecified: Secondary | ICD-10-CM

## 2016-05-08 LAB — POCT CBC W AUTO DIFF (K'VILLE URGENT CARE)

## 2016-05-08 LAB — POCT URINE PREGNANCY: Preg Test, Ur: NEGATIVE

## 2016-05-08 MED ORDER — MECLIZINE HCL 25 MG PO TABS: 25.0000 mg | ORAL_TABLET | Freq: Three times a day (TID) | ORAL | Status: DC | PRN

## 2016-05-08 NOTE — Discharge Instructions (Signed)
Benign Positional Vertigo °Vertigo is the feeling that you or your surroundings are moving when they are not. Benign positional vertigo is the most common form of vertigo. The cause of this condition is not serious (is benign). This condition is triggered by certain movements and positions (is positional). This condition can be dangerous if it occurs while you are doing something that could endanger you or others, such as driving.  °CAUSES °In many cases, the cause of this condition is not known. It may be caused by a disturbance in an area of the inner ear that helps your brain to sense movement and balance. This disturbance can be caused by a viral infection (labyrinthitis), head injury, or repetitive motion. °RISK FACTORS °This condition is more likely to develop in: °· Women. °· People who are 50 years of age or older. °SYMPTOMS °Symptoms of this condition usually happen when you move your head or your eyes in different directions. Symptoms may start suddenly, and they usually last for less than a minute. Symptoms may include: °· Loss of balance and falling. °· Feeling like you are spinning or moving. °· Feeling like your surroundings are spinning or moving. °· Nausea and vomiting. °· Blurred vision. °· Dizziness. °· Involuntary eye movement (nystagmus). °Symptoms can be mild and cause only slight annoyance, or they can be severe and interfere with daily life. Episodes of benign positional vertigo may return (recur) over time, and they may be triggered by certain movements. Symptoms may improve over time. °DIAGNOSIS °This condition is usually diagnosed by medical history and a physical exam of the head, neck, and ears. You may be referred to a health care provider who specializes in ear, nose, and throat (ENT) problems (otolaryngologist) or a provider who specializes in disorders of the nervous system (neurologist). You may have additional testing, including: °· MRI. °· A CT scan. °· Eye movement tests. Your  health care provider may ask you to change positions quickly while he or she watches you for symptoms of benign positional vertigo, such as nystagmus. Eye movement may be tested with an electronystagmogram (ENG), caloric stimulation, the Dix-Hallpike test, or the roll test. °· An electroencephalogram (EEG). This records electrical activity in your brain. °· Hearing tests. °TREATMENT °Usually, your health care provider will treat this by moving your head in specific positions to adjust your inner ear back to normal. Surgery may be needed in severe cases, but this is rare. In some cases, benign positional vertigo may resolve on its own in 2-4 weeks. °HOME CARE INSTRUCTIONS °Safety °· Move slowly. Avoid sudden body or head movements. °· Avoid driving. °· Avoid operating heavy machinery. °· Avoid doing any tasks that would be dangerous to you or others if a vertigo episode would occur. °· If you have trouble walking or keeping your balance, try using a cane for stability. If you feel dizzy or unstable, sit down right away. °· Return to your normal activities as told by your health care provider. Ask your health care provider what activities are safe for you. °General Instructions °· Take over-the-counter and prescription medicines only as told by your health care provider. °· Avoid certain positions or movements as told by your health care provider. °· Drink enough fluid to keep your urine clear or pale yellow. °· Keep all follow-up visits as told by your health care provider. This is important. °SEEK MEDICAL CARE IF: °· You have a fever. °· Your condition gets worse or you develop new symptoms. °· Your family or friends   notice any behavioral changes. °· Your nausea or vomiting gets worse. °· You have numbness or a "pins and needles" sensation. °SEEK IMMEDIATE MEDICAL CARE IF: °· You have difficulty speaking or moving. °· You are always dizzy. °· You faint. °· You develop severe headaches. °· You have weakness in your  legs or arms. °· You have changes in your hearing or vision. °· You develop a stiff neck. °· You develop sensitivity to light. °  °This information is not intended to replace advice given to you by your health care provider. Make sure you discuss any questions you have with your health care provider. °  °Document Released: 08/06/2006 Document Revised: 07/20/2015 Document Reviewed: 02/21/2015 °Elsevier Interactive Patient Education ©2016 [employer] ° °Dizziness °Dizziness is a common problem. It makes you feel unsteady or lightheaded. You may feel like you are about to pass out (faint). Dizziness can lead to injury if you stumble or fall. Anyone can get dizzy, but dizziness is more common in older adults. This condition can be caused by a number of things, including: °· Medicines. °· Dehydration. °· Illness. °HOME CARE °Following these instructions may help with your condition: °Eating and Drinking °· Drink enough fluid to keep your pee (urine) clear or pale yellow. This helps to keep you from getting dehydrated. Try to drink more clear fluids, such as water. °· Do not drink alcohol. °· Limit how much caffeine you drink or eat if told by your doctor. °· Limit how much salt you drink or eat if told by your doctor. °Activity °· Avoid making quick movements. °¨ When you stand up from sitting in a chair, steady yourself until you feel okay. °¨ In the morning, first sit up on the side of the bed. When you feel okay, stand slowly while you hold onto something. Do this until you know that your balance is fine. °· Move your legs often if you need to stand in one place for a long time. Tighten and relax your muscles in your legs while you are standing. °· Do not drive or use heavy machinery if you feel dizzy. °· Avoid bending down if you feel dizzy. Place items in your home so that they are easy for you to reach without leaning over. °Lifestyle °· Do not use any tobacco products, including cigarettes, chewing tobacco, or  electronic cigarettes. If you need help quitting, ask your doctor. °· Try to lower your stress level, such as with yoga or meditation. Talk with your doctor if you need help. °General Instructions °· Watch your dizziness for any changes. °· Take medicines only as told by your doctor. Talk with your doctor if you think that your dizziness is caused by a medicine that you are taking. °· Tell a friend or a family member that you are feeling dizzy. If he or she notices any changes in your behavior, have this person call your doctor. °· Keep all follow-up visits as told by your doctor. This is important. °GET HELP IF: °· Your dizziness does not go away. °· Your dizziness or light-headedness gets worse. °· You feel sick to your stomach (nauseous). °· You have trouble hearing. °· You have new symptoms. °· You are unsteady on your feet or you feel like the room is spinning. °GET HELP RIGHT AWAY IF: °· You throw up (vomit) or have diarrhea and are unable to eat or drink anything. °· You have trouble: °¨ Talking. °¨ Walking. °¨ Swallowing. °¨ Using your arms, hands, or legs. °·   You feel generally weak. °· You are not thinking clearly or you have trouble forming sentences. It may take a friend or family member to notice this. °· You have: °¨ Chest pain. °¨ Pain in your belly (abdomen). °¨ Shortness of breath. °¨ Sweating. °· Your vision changes. °· You are bleeding. °· You have a headache. °· You have neck pain or a stiff neck. °· You have a fever. °  °This information is not intended to replace advice given to you by your health care provider. Make sure you discuss any questions you have with your health care provider. °  °Document Released: 10/18/2011 Document Revised: 03/15/2015 Document Reviewed: 10/25/2014 °Elsevier Interactive Patient Education ©2016 [employer] ° °

## 2016-05-08 NOTE — ED Provider Notes (Signed)
CSN: [id number]     Arrival date & time 05/08/16  1834 History   First MD Initiated Contact with Patient 05/08/16 1841     Chief Complaint  Patient presents with  . Nausea  . Dizziness   (Consider location/radiation/quality/duration/timing/severity/associated sxs/prior Treatment) HPI  Allison Riggs is a 30 y.o. female presenting to UC with c/o 3 weeks of intermittent dizziness and nausea.  Nausea is mild but worse with eating and worse in the afternoon.  She also notes her last menstrual period was 03/10/16 and she is normally very regular, only off by 1 day, never weeks or months. She has taken several home pregnancy tests that have been negative.  Denies abdominal pain at this time but did have mild LLQ pain 1-2 weeks ago c/w ovarian cyst pain but not as severe as normal.  Denies fever, chills, headache, chest pain, SOB. Denies passing out. No hx of vertigo.  She has been eating and drinking well. She did have a f/u appointment scheduled with her OB/GYN tomorrow but will have to miss it due to work.     Past Medical History  Diagnosis Date  . Anxiety   . Anxiety    Past Surgical History  Procedure Laterality Date  . Wisdom tooth extraction    . Ganglion cyst excision     History reviewed. No pertinent family history. Social History  Substance Use Topics  . Smoking status: Never Smoker   . Smokeless tobacco: Never Used  . Alcohol Use: Yes   OB History    No data available     Review of Systems  Constitutional: Negative for fever and chills.  HENT: Negative for congestion, ear pain, sore throat, trouble swallowing and voice change.   Respiratory: Negative for cough and shortness of breath.   Cardiovascular: Negative for chest pain and palpitations.  Gastrointestinal: Positive for nausea. Negative for vomiting, abdominal pain and diarrhea.  Musculoskeletal: Negative for myalgias, back pain and arthralgias.  Skin: Negative for rash.  Neurological: Positive for dizziness  and light-headedness. Negative for headaches.    Allergies  Review of patient's allergies indicates no known allergies.  Home Medications   Prior to Admission medications   Medication Sig Start Date End Date Taking? Authorizing Provider  buPROPion (WELLBUTRIN SR) 200 MG 12 hr tablet Take 200 mg by mouth 2 (two) times daily.   Yes Historical Provider, MD  escitalopram (LEXAPRO) 20 MG tablet Take 20 mg by mouth daily.   Yes Historical Provider, MD  meclizine (ANTIVERT) 25 MG tablet Take 1 tablet (25 mg total) by mouth 3 (three) times daily as needed for dizziness or nausea. 05/08/16   Junius FinnerErin O'Malley, PA-C   Meds Ordered and Administered this Visit  Medications - No data to display  BP 141/94 mmHg  Pulse 112  Temp(Src) 98 F (36.7 C) (Oral)  Resp 16  Ht 5\' 5"  (1.651 m)  Wt 157 lb (71.215 kg)  BMI 26.13 kg/m2  SpO2 99%  LMP 03/10/2016 No data found.   Physical Exam  Constitutional: She is oriented to person, place, and time. She appears well-developed and well-nourished. No distress.  HENT:  Head: Normocephalic and atraumatic.  Right Ear: Tympanic membrane normal.  Left Ear: Tympanic membrane normal.  Nose: Nose normal.  Mouth/Throat: Uvula is midline, oropharynx is clear and moist and mucous membranes are normal.  Eyes: Conjunctivae are normal. No scleral icterus. Right eye exhibits nystagmus. Left eye exhibits nystagmus.  Horizontal nystagmus to the Left.  Neck: Normal range  of motion. Neck supple.  Cardiovascular: Normal rate, regular rhythm and normal heart sounds.   Tachycardic in triage, regular rate and rhythm on exam.  Pulmonary/Chest: Effort normal and breath sounds normal. No respiratory distress. She has no wheezes. She has no rales. She exhibits no tenderness.  Abdominal: Soft. Bowel sounds are normal. She exhibits no distension and no mass. There is no tenderness. There is no rebound and no guarding.  Musculoskeletal: Normal range of motion.  Neurological: She is  alert and oriented to person, place, and time. No cranial nerve deficit. Coordination normal.  Skin: Skin is warm and dry. She is not diaphoretic.  Nursing note and vitals reviewed.   ED Course  Procedures (including critical care time)  Labs Review Labs Reviewed  COMPLETE METABOLIC PANEL WITH GFR  TSH  HCG, QUANTITATIVE, PREGNANCY  POCT URINE PREGNANCY  POCT CBC W AUTO DIFF ([facility])    Imaging Review No results found.   MDM   1. Benign paroxysmal positional vertigo, left   2. Nausea without vomiting   3. Menstrual period late    Pt c/o dizziness, nausea and late menstrual cycle.   Exam c/w vertigo  Urine preg: negative CBC: WNL  Blood work sent to lab: CMP, TSH, and quantitative hCG  Encouraged f/u with PCP or OB/GYN (who she uses for her PCP) in 1 week if not improving, sooner if worsening.  Discussed symptoms that warrant emergent care in the ED. Patient verbalized understanding and agreement with treatment plan.     Junius Finnerrin O'Malley, PA-C 05/08/16 1944

## 2016-05-08 NOTE — ED Notes (Signed)
Pt c/o nausea and dizziness x 3 wks. Last period 03/10/16.

## 2016-05-09 ENCOUNTER — Telehealth: Payer: Self-pay | Admitting: *Deleted

## 2016-05-09 LAB — TSH: TSH: 2.65 mIU/L

## 2016-05-09 LAB — COMPLETE METABOLIC PANEL WITH GFR
ALT: 19 U/L (ref 6–29)
AST: 15 U/L (ref 10–30)
Albumin: 4.2 g/dL (ref 3.6–5.1)
Alkaline Phosphatase: 62 U/L (ref 33–115)
BUN: 9 mg/dL (ref 7–25)
CO2: 24 mmol/L (ref 20–31)
Calcium: 9.1 mg/dL (ref 8.6–10.2)
Chloride: 104 mmol/L (ref 98–110)
Creat: 0.74 mg/dL (ref 0.50–1.10)
GFR, Est African American: >89 mL/min (ref >=60)
GFR, Est Non African American: >89 mL/min (ref >=60)
Glucose, Bld: 90 mg/dL (ref 65–99)
Potassium: 3.8 mmol/L (ref 3.5–5.3)
Sodium: 138 mmol/L (ref 135–146)
Total Bilirubin: 0.3 mg/dL (ref 0.2–1.2)
Total Protein: 7 g/dL (ref 6.1–8.1)

## 2016-05-09 LAB — HCG, QUANTITATIVE, PREGNANCY: hCG, Beta Chain, Quant, S: <2 m[IU]/mL

## 2016-05-09 NOTE — ED Notes (Unsigned)
LM with results, f/u with PCP if s/s fail to improve and to call back if she has any questions or concerns. Clemens Catholichristy Avana Kreiser, LPN

## 2017-06-10 DIAGNOSIS — O3680X0 Pregnancy with inconclusive fetal viability, not applicable or unspecified: Secondary | ICD-10-CM | POA: Diagnosis not present

## 2017-06-10 DIAGNOSIS — Z3A01 Less than 8 weeks gestation of pregnancy: Secondary | ICD-10-CM | POA: Diagnosis not present

## 2017-06-10 DIAGNOSIS — Z3689 Encounter for other specified antenatal screening: Secondary | ICD-10-CM | POA: Diagnosis not present

## 2017-07-12 DIAGNOSIS — Z3A12 12 weeks gestation of pregnancy: Secondary | ICD-10-CM | POA: Diagnosis not present

## 2017-07-12 DIAGNOSIS — Z3689 Encounter for other specified antenatal screening: Secondary | ICD-10-CM | POA: Diagnosis not present

## 2017-07-12 DIAGNOSIS — Z3682 Encounter for antenatal screening for nuchal translucency: Secondary | ICD-10-CM | POA: Diagnosis not present

## 2017-08-08 DIAGNOSIS — Z3689 Encounter for other specified antenatal screening: Secondary | ICD-10-CM | POA: Diagnosis not present

## 2017-08-21 DIAGNOSIS — Z23 Encounter for immunization: Secondary | ICD-10-CM | POA: Diagnosis not present

## 2017-09-06 DIAGNOSIS — O0992 Supervision of high risk pregnancy, unspecified, second trimester: Secondary | ICD-10-CM | POA: Diagnosis not present

## 2017-09-06 DIAGNOSIS — Z8759 Personal history of other complications of pregnancy, childbirth and the puerperium: Secondary | ICD-10-CM | POA: Diagnosis not present

## 2017-09-06 DIAGNOSIS — Z363 Encounter for antenatal screening for malformations: Secondary | ICD-10-CM | POA: Diagnosis not present

## 2017-09-06 DIAGNOSIS — Z3A20 20 weeks gestation of pregnancy: Secondary | ICD-10-CM | POA: Diagnosis not present

## 2017-09-27 DIAGNOSIS — Z369 Encounter for antenatal screening, unspecified: Secondary | ICD-10-CM | POA: Diagnosis not present

## 2017-09-30 DIAGNOSIS — Z3689 Encounter for other specified antenatal screening: Secondary | ICD-10-CM | POA: Diagnosis not present

## 2017-09-30 DIAGNOSIS — Z8759 Personal history of other complications of pregnancy, childbirth and the puerperium: Secondary | ICD-10-CM | POA: Diagnosis not present

## 2017-10-09 DIAGNOSIS — O0992 Supervision of high risk pregnancy, unspecified, second trimester: Secondary | ICD-10-CM | POA: Diagnosis not present

## 2017-10-09 DIAGNOSIS — Z3A25 25 weeks gestation of pregnancy: Secondary | ICD-10-CM | POA: Diagnosis not present

## 2017-10-09 DIAGNOSIS — O162 Unspecified maternal hypertension, second trimester: Secondary | ICD-10-CM | POA: Diagnosis not present

## 2017-10-17 DIAGNOSIS — M545 Low back pain: Secondary | ICD-10-CM | POA: Diagnosis not present

## 2017-10-30 DIAGNOSIS — Z3A28 28 weeks gestation of pregnancy: Secondary | ICD-10-CM | POA: Diagnosis not present

## 2017-10-30 DIAGNOSIS — O163 Unspecified maternal hypertension, third trimester: Secondary | ICD-10-CM | POA: Diagnosis not present

## 2017-10-30 DIAGNOSIS — Z3689 Encounter for other specified antenatal screening: Secondary | ICD-10-CM | POA: Diagnosis not present

## 2017-10-30 DIAGNOSIS — Z8759 Personal history of other complications of pregnancy, childbirth and the puerperium: Secondary | ICD-10-CM | POA: Diagnosis not present

## 2017-10-30 DIAGNOSIS — O0992 Supervision of high risk pregnancy, unspecified, second trimester: Secondary | ICD-10-CM | POA: Diagnosis not present

## 2017-10-31 DIAGNOSIS — Z8759 Personal history of other complications of pregnancy, childbirth and the puerperium: Secondary | ICD-10-CM | POA: Diagnosis not present

## 2017-11-07 DIAGNOSIS — O163 Unspecified maternal hypertension, third trimester: Secondary | ICD-10-CM | POA: Diagnosis not present

## 2017-11-07 DIAGNOSIS — Z8759 Personal history of other complications of pregnancy, childbirth and the puerperium: Secondary | ICD-10-CM | POA: Diagnosis not present

## 2017-11-07 DIAGNOSIS — Z3A29 29 weeks gestation of pregnancy: Secondary | ICD-10-CM | POA: Diagnosis not present

## 2017-11-07 DIAGNOSIS — O0993 Supervision of high risk pregnancy, unspecified, third trimester: Secondary | ICD-10-CM | POA: Diagnosis not present

## 2017-11-29 DIAGNOSIS — O163 Unspecified maternal hypertension, third trimester: Secondary | ICD-10-CM | POA: Diagnosis not present

## 2017-11-29 DIAGNOSIS — Z3A32 32 weeks gestation of pregnancy: Secondary | ICD-10-CM | POA: Diagnosis not present

## 2017-11-29 DIAGNOSIS — O99343 Other mental disorders complicating pregnancy, third trimester: Secondary | ICD-10-CM | POA: Diagnosis not present

## 2017-11-29 DIAGNOSIS — O0993 Supervision of high risk pregnancy, unspecified, third trimester: Secondary | ICD-10-CM | POA: Diagnosis not present

## 2017-12-03 DIAGNOSIS — Z3A33 33 weeks gestation of pregnancy: Secondary | ICD-10-CM | POA: Diagnosis not present

## 2017-12-03 DIAGNOSIS — O163 Unspecified maternal hypertension, third trimester: Secondary | ICD-10-CM | POA: Diagnosis not present

## 2017-12-05 DIAGNOSIS — O0993 Supervision of high risk pregnancy, unspecified, third trimester: Secondary | ICD-10-CM | POA: Diagnosis not present

## 2017-12-05 DIAGNOSIS — O163 Unspecified maternal hypertension, third trimester: Secondary | ICD-10-CM | POA: Diagnosis not present

## 2017-12-05 DIAGNOSIS — O133 Gestational [pregnancy-induced] hypertension without significant proteinuria, third trimester: Secondary | ICD-10-CM | POA: Diagnosis not present

## 2017-12-05 DIAGNOSIS — Z3A33 33 weeks gestation of pregnancy: Secondary | ICD-10-CM | POA: Diagnosis not present

## 2017-12-05 DIAGNOSIS — R51 Headache: Secondary | ICD-10-CM | POA: Diagnosis not present

## 2017-12-05 DIAGNOSIS — O1413 Severe pre-eclampsia, third trimester: Secondary | ICD-10-CM | POA: Diagnosis not present

## 2017-12-05 DIAGNOSIS — Z8759 Personal history of other complications of pregnancy, childbirth and the puerperium: Secondary | ICD-10-CM | POA: Diagnosis not present

## 2017-12-06 DIAGNOSIS — O1413 Severe pre-eclampsia, third trimester: Secondary | ICD-10-CM | POA: Diagnosis not present

## 2017-12-06 DIAGNOSIS — F419 Anxiety disorder, unspecified: Secondary | ICD-10-CM | POA: Diagnosis not present

## 2017-12-06 DIAGNOSIS — O1002 Pre-existing essential hypertension complicating childbirth: Secondary | ICD-10-CM | POA: Diagnosis not present

## 2017-12-06 DIAGNOSIS — Z8759 Personal history of other complications of pregnancy, childbirth and the puerperium: Secondary | ICD-10-CM | POA: Diagnosis not present

## 2017-12-06 DIAGNOSIS — G43909 Migraine, unspecified, not intractable, without status migrainosus: Secondary | ICD-10-CM | POA: Diagnosis not present

## 2017-12-06 DIAGNOSIS — Z79899 Other long term (current) drug therapy: Secondary | ICD-10-CM | POA: Diagnosis not present

## 2017-12-06 DIAGNOSIS — F329 Major depressive disorder, single episode, unspecified: Secondary | ICD-10-CM | POA: Diagnosis not present

## 2017-12-06 DIAGNOSIS — Z8751 Personal history of pre-term labor: Secondary | ICD-10-CM | POA: Diagnosis not present

## 2017-12-06 DIAGNOSIS — Z3A00 Weeks of gestation of pregnancy not specified: Secondary | ICD-10-CM | POA: Diagnosis not present

## 2017-12-06 DIAGNOSIS — O99344 Other mental disorders complicating childbirth: Secondary | ICD-10-CM | POA: Diagnosis not present

## 2017-12-06 DIAGNOSIS — R51 Headache: Secondary | ICD-10-CM | POA: Diagnosis not present

## 2017-12-06 DIAGNOSIS — O163 Unspecified maternal hypertension, third trimester: Secondary | ICD-10-CM | POA: Diagnosis not present

## 2017-12-06 DIAGNOSIS — O1490 Unspecified pre-eclampsia, unspecified trimester: Secondary | ICD-10-CM | POA: Diagnosis not present

## 2017-12-06 DIAGNOSIS — O114 Pre-existing hypertension with pre-eclampsia, complicating childbirth: Secondary | ICD-10-CM | POA: Diagnosis not present

## 2017-12-06 DIAGNOSIS — O99354 Diseases of the nervous system complicating childbirth: Secondary | ICD-10-CM | POA: Diagnosis not present

## 2017-12-06 DIAGNOSIS — Z3A33 33 weeks gestation of pregnancy: Secondary | ICD-10-CM | POA: Diagnosis not present

## 2017-12-06 DIAGNOSIS — O133 Gestational [pregnancy-induced] hypertension without significant proteinuria, third trimester: Secondary | ICD-10-CM | POA: Diagnosis not present

## 2017-12-08 DIAGNOSIS — Z8751 Personal history of pre-term labor: Secondary | ICD-10-CM | POA: Diagnosis not present

## 2017-12-08 DIAGNOSIS — O1002 Pre-existing essential hypertension complicating childbirth: Secondary | ICD-10-CM | POA: Diagnosis not present

## 2017-12-08 DIAGNOSIS — O114 Pre-existing hypertension with pre-eclampsia, complicating childbirth: Secondary | ICD-10-CM | POA: Diagnosis not present

## 2018-07-24 DIAGNOSIS — Z13220 Encounter for screening for lipoid disorders: Secondary | ICD-10-CM | POA: Diagnosis not present

## 2018-07-24 DIAGNOSIS — Z01419 Encounter for gynecological examination (general) (routine) without abnormal findings: Secondary | ICD-10-CM | POA: Diagnosis not present

## 2018-07-24 DIAGNOSIS — R030 Elevated blood-pressure reading, without diagnosis of hypertension: Secondary | ICD-10-CM | POA: Diagnosis not present

## 2018-07-24 DIAGNOSIS — Z1339 Encounter for screening examination for other mental health and behavioral disorders: Secondary | ICD-10-CM | POA: Diagnosis not present

## 2018-07-24 DIAGNOSIS — F3341 Major depressive disorder, recurrent, in partial remission: Secondary | ICD-10-CM | POA: Diagnosis not present

## 2018-08-08 DIAGNOSIS — Z23 Encounter for immunization: Secondary | ICD-10-CM | POA: Diagnosis not present

## 2018-08-13 DIAGNOSIS — R87612 Low grade squamous intraepithelial lesion on cytologic smear of cervix (LGSIL): Secondary | ICD-10-CM | POA: Diagnosis not present

## 2018-08-14 DIAGNOSIS — N870 Mild cervical dysplasia: Secondary | ICD-10-CM | POA: Diagnosis not present

## 2018-12-09 DIAGNOSIS — R509 Fever, unspecified: Secondary | ICD-10-CM | POA: Diagnosis not present

## 2018-12-09 DIAGNOSIS — Z6823 Body mass index (BMI) 23.0-23.9, adult: Secondary | ICD-10-CM | POA: Diagnosis not present

## 2018-12-09 DIAGNOSIS — J40 Bronchitis, not specified as acute or chronic: Secondary | ICD-10-CM | POA: Diagnosis not present

## 2018-12-09 DIAGNOSIS — J329 Chronic sinusitis, unspecified: Secondary | ICD-10-CM | POA: Diagnosis not present

## 2018-12-09 DIAGNOSIS — F3341 Major depressive disorder, recurrent, in partial remission: Secondary | ICD-10-CM | POA: Diagnosis not present

## 2018-12-29 DIAGNOSIS — J329 Chronic sinusitis, unspecified: Secondary | ICD-10-CM | POA: Diagnosis not present

## 2018-12-29 DIAGNOSIS — J40 Bronchitis, not specified as acute or chronic: Secondary | ICD-10-CM | POA: Diagnosis not present

## 2018-12-29 DIAGNOSIS — Z6824 Body mass index (BMI) 24.0-24.9, adult: Secondary | ICD-10-CM | POA: Diagnosis not present

## 2019-04-01 DIAGNOSIS — F3341 Major depressive disorder, recurrent, in partial remission: Secondary | ICD-10-CM | POA: Diagnosis not present

## 2019-04-01 DIAGNOSIS — F419 Anxiety disorder, unspecified: Secondary | ICD-10-CM | POA: Diagnosis not present

## 2019-04-01 DIAGNOSIS — Z6825 Body mass index (BMI) 25.0-25.9, adult: Secondary | ICD-10-CM | POA: Diagnosis not present

## 2019-04-15 DIAGNOSIS — F419 Anxiety disorder, unspecified: Secondary | ICD-10-CM | POA: Diagnosis not present

## 2019-04-15 DIAGNOSIS — G43909 Migraine, unspecified, not intractable, without status migrainosus: Secondary | ICD-10-CM | POA: Diagnosis not present

## 2019-04-15 DIAGNOSIS — J302 Other seasonal allergic rhinitis: Secondary | ICD-10-CM | POA: Diagnosis not present

## 2019-04-15 DIAGNOSIS — Z6824 Body mass index (BMI) 24.0-24.9, adult: Secondary | ICD-10-CM | POA: Diagnosis not present

## 2019-07-31 DIAGNOSIS — Z3201 Encounter for pregnancy test, result positive: Secondary | ICD-10-CM | POA: Diagnosis not present

## 2019-07-31 DIAGNOSIS — Z3689 Encounter for other specified antenatal screening: Secondary | ICD-10-CM | POA: Diagnosis not present

## 2019-07-31 DIAGNOSIS — N926 Irregular menstruation, unspecified: Secondary | ICD-10-CM | POA: Diagnosis not present

## 2019-08-04 DIAGNOSIS — N926 Irregular menstruation, unspecified: Secondary | ICD-10-CM | POA: Diagnosis not present

## 2019-08-14 DIAGNOSIS — Z3201 Encounter for pregnancy test, result positive: Secondary | ICD-10-CM | POA: Diagnosis not present

## 2019-08-21 ENCOUNTER — Other Ambulatory Visit: Payer: Self-pay

## 2019-08-21 ENCOUNTER — Encounter (HOSPITAL_BASED_OUTPATIENT_CLINIC_OR_DEPARTMENT_OTHER): Payer: Self-pay | Admitting: *Deleted

## 2019-08-21 ENCOUNTER — Emergency Department (HOSPITAL_BASED_OUTPATIENT_CLINIC_OR_DEPARTMENT_OTHER)
Admission: EM | Admit: 2019-08-21 | Discharge: 2019-08-21 | Disposition: A | Discharge status: Home / Self Care | Payer: BC Managed Care – PPO | Attending: Emergency Medicine | Admitting: Emergency Medicine

## 2019-08-21 ENCOUNTER — Emergency Department (HOSPITAL_BASED_OUTPATIENT_CLINIC_OR_DEPARTMENT_OTHER): Payer: BC Managed Care – PPO

## 2019-08-21 DIAGNOSIS — N201 Calculus of ureter: Secondary | ICD-10-CM

## 2019-08-21 DIAGNOSIS — O26831 Pregnancy related renal disease, first trimester: Secondary | ICD-10-CM | POA: Diagnosis not present

## 2019-08-21 DIAGNOSIS — O23591 Infection of other part of genital tract in pregnancy, first trimester: Secondary | ICD-10-CM | POA: Diagnosis not present

## 2019-08-21 DIAGNOSIS — M549 Dorsalgia, unspecified: Secondary | ICD-10-CM | POA: Diagnosis present

## 2019-08-21 DIAGNOSIS — R109 Unspecified abdominal pain: Secondary | ICD-10-CM | POA: Diagnosis not present

## 2019-08-21 DIAGNOSIS — N3000 Acute cystitis without hematuria: Secondary | ICD-10-CM | POA: Diagnosis not present

## 2019-08-21 DIAGNOSIS — N1330 Unspecified hydronephrosis: Secondary | ICD-10-CM | POA: Diagnosis not present

## 2019-08-21 LAB — COMPREHENSIVE METABOLIC PANEL
ALT: 17 U/L (ref 0–44)
AST: 19 U/L (ref 15–41)
Albumin: 4.1 g/dL (ref 3.5–5.0)
Alkaline Phosphatase: 73 U/L (ref 38–126)
Anion gap: 11 (ref 5–15)
BUN: 12 mg/dL (ref 6–20)
CO2: 23 mmol/L (ref 22–32)
Calcium: 9.5 mg/dL (ref 8.9–10.3)
Chloride: 102 mmol/L (ref 98–111)
Creatinine, Ser: 0.57 mg/dL (ref 0.44–1.00)
GFR calc Af Amer: >60 mL/min (ref >60)
GFR calc non Af Amer: >60 mL/min (ref >60)
Glucose, Bld: 105 mg/dL — ABNORMAL HIGH (ref 70–99)
Potassium: 3.8 mmol/L (ref 3.5–5.1)
Sodium: 136 mmol/L (ref 135–145)
Total Bilirubin: 0.3 mg/dL (ref 0.3–1.2)
Total Protein: 7.2 g/dL (ref 6.5–8.1)

## 2019-08-21 LAB — CBC WITH DIFFERENTIAL/PLATELET
Abs Immature Granulocytes: 0.05 10*3/uL (ref 0.00–0.07)
Basophils Absolute: 0 10*3/uL (ref 0.0–0.1)
Basophils Relative: 0 %
Eosinophils Absolute: 0 10*3/uL (ref 0.0–0.5)
Eosinophils Relative: 0 %
HCT: 40.4 % (ref 36.0–46.0)
Hemoglobin: 13.1 g/dL (ref 12.0–15.0)
Immature Granulocytes: 0 %
Lymphocytes Relative: 13 %
Lymphs Abs: 1.4 10*3/uL (ref 0.7–4.0)
MCH: 30.7 pg (ref 26.0–34.0)
MCHC: 32.4 g/dL (ref 30.0–36.0)
MCV: 94.6 fL (ref 80.0–100.0)
Monocytes Absolute: 0.5 10*3/uL (ref 0.1–1.0)
Monocytes Relative: 4 %
Neutro Abs: 9.4 10*3/uL — ABNORMAL HIGH (ref 1.7–7.7)
Neutrophils Relative %: 83 %
Platelets: 248 10*3/uL (ref 150–400)
RBC: 4.27 MIL/uL (ref 3.87–5.11)
RDW: 12.8 % (ref 11.5–15.5)
WBC: 11.5 10*3/uL — ABNORMAL HIGH (ref 4.0–10.5)
nRBC: 0 % (ref 0.0–0.2)

## 2019-08-21 LAB — URINALYSIS, MICROSCOPIC (REFLEX)

## 2019-08-21 LAB — URINALYSIS, ROUTINE W REFLEX MICROSCOPIC
Bilirubin Urine: NEGATIVE
Glucose, UA: 100 mg/dL — AB
Ketones, ur: NEGATIVE mg/dL
Leukocytes,Ua: NEGATIVE
Nitrite: POSITIVE — AB
Protein, ur: NEGATIVE mg/dL
Specific Gravity, Urine: 1.025 (ref 1.005–1.030)
pH: 6 (ref 5.0–8.0)

## 2019-08-21 LAB — PREGNANCY, URINE: Preg Test, Ur: POSITIVE — AB

## 2019-08-21 MED ORDER — ONDANSETRON 4 MG PO TBDP: 4.0000 mg | ORAL_TABLET | Freq: Three times a day (TID) | ORAL | 0 refills | Status: DC | PRN

## 2019-08-21 MED ORDER — ONDANSETRON 4 MG PO TBDP
4.0000 mg | ORAL_TABLET | Freq: Once | ORAL | Status: AC
  Administered 2019-08-21: 4 mg via ORAL
  Filled 2019-08-21: qty 1

## 2019-08-21 MED ORDER — CEPHALEXIN 500 MG PO CAPS: 500.0000 mg | ORAL_CAPSULE | Freq: Four times a day (QID) | ORAL | 0 refills | Status: AC

## 2019-08-21 MED ORDER — OXYCODONE-ACETAMINOPHEN 5-325 MG PO TABS: 2.0000 | ORAL_TABLET | ORAL | 0 refills | Status: DC | PRN

## 2019-08-21 MED ORDER — ACETAMINOPHEN 500 MG PO TABS
1000.0000 mg | ORAL_TABLET | Freq: Once | ORAL | Status: AC
  Administered 2019-08-21: 1000 mg via ORAL
  Filled 2019-08-21: qty 2

## 2019-08-21 MED ORDER — SODIUM CHLORIDE 0.9 % IV SOLN
1.0000 g | Freq: Once | INTRAVENOUS | Status: AC
  Administered 2019-08-21: 1 g via INTRAVENOUS
  Filled 2019-08-21: qty 10

## 2019-08-21 NOTE — ED Triage Notes (Signed)
Back pain x 2 hours denies inj, feels urgency to urinate but decreased amount, denies vag dc  Pt is 8 weeks preg

## 2019-08-21 NOTE — ED Notes (Signed)
Patient transported to X-ray & Ultrasound. 

## 2019-08-21 NOTE — ED Provider Notes (Signed)
Emergency Department Provider Note   I have reviewed the triage vital signs and the nursing notes.   HISTORY  Chief Complaint Back Pain   HPI Allison Riggs is a 33 y.o. female with past medical history of multiple kidney stones, none of which requiring intervention, presents to the emergency department with acute onset right-sided back pain and urinary urgency.  Patient is approximately [redacted] weeks pregnant with IUP confirmed on OB ultrasound in the office, per patient.  She denies any fevers or shaking chills.  No vaginal discharge or bleeding.  She denies any pain with urination.  No fevers or shaking chills.   History reviewed. No pertinent past medical history.  There are no active problems to display for this patient.   History reviewed. No pertinent surgical history.  Allergies Patient has no known allergies.  No family history on file.  Social History Social History   Tobacco Use  . Smoking status: Not on file  Substance Use Topics  . Alcohol use: Not on file  . Drug use: Not on file    Review of Systems  Constitutional: No fever/chills Eyes: No visual changes. ENT: No sore throat. Cardiovascular: Denies chest pain. Respiratory: Denies shortness of breath. Gastrointestinal: Positive right flank pain. Positive nausea, no vomiting.  No diarrhea.  No constipation. Genitourinary: Negative for dysuria. Musculoskeletal: Negative for back pain. Skin: Negative for rash. Neurological: Negative for headaches, focal weakness or numbness.  10-point ROS otherwise negative.  ____________________________________________   PHYSICAL EXAM:  VITAL SIGNS: ED Triage Vitals  Enc Vitals Group     BP 08/21/19 1838 (!) 143/101     Pulse Rate 08/21/19 1838 83     Resp 08/21/19 1838 18     Temp 08/21/19 1838 97.9 F (36.6 C)     Temp Source 08/21/19 1838 Oral     SpO2 08/21/19 1838 100 %     Weight 08/21/19 1832 155 lb (70.3 kg)     Height 08/21/19 1832 5\' 6"  (1.676  m)   Constitutional: Alert and oriented. Well appearing and in no acute distress. Eyes: Conjunctivae are normal. Head: Atraumatic. Nose: No congestion/rhinnorhea. Mouth/Throat: Mucous membranes are moist.   Neck: No stridor.  Cardiovascular: Normal rate, regular rhythm. Good peripheral circulation. Grossly normal heart sounds.   Respiratory: Normal respiratory effort.  No retractions. Lungs CTAB. Gastrointestinal: Soft and nontender. No distention. No CVA tenderness.  Musculoskeletal: No lower extremity tenderness nor edema. No gross deformities of extremities. Neurologic:  Normal speech and language. No gross focal neurologic deficits are appreciated.  Skin:  Skin is warm, dry and intact. No rash noted.   ____________________________________________   LABS (all labs ordered are listed, but only abnormal results are displayed)  Labs Reviewed  URINALYSIS, ROUTINE W REFLEX MICROSCOPIC - Abnormal; Notable for the following components:      Result Value   Color, Urine ORANGE (*)    Glucose, UA 100 (*)    Hgb urine dipstick SMALL (*)    Nitrite POSITIVE (*)    All other components within normal limits  URINALYSIS, MICROSCOPIC (REFLEX) - Abnormal; Notable for the following components:   Bacteria, UA MANY (*)    All other components within normal limits  COMPREHENSIVE METABOLIC PANEL - Abnormal; Notable for the following components:   Glucose, Bld 105 (*)    All other components within normal limits  CBC WITH DIFFERENTIAL/PLATELET - Abnormal; Notable for the following components:   WBC 11.5 (*)    Neutro Abs 9.4 (*)  All other components within normal limits  PREGNANCY, URINE - Abnormal; Notable for the following components:   Preg Test, Ur POSITIVE (*)    All other components within normal limits  URINE CULTURE   ____________________________________________  RADIOLOGY  Dg Abdomen 1 View  Result Date: 08/21/2019 CLINICAL DATA:  Right-sided flank pain EXAM: ABDOMEN - 1  VIEW COMPARISON:  None. FINDINGS: There is a moderate amount of stool in the ascending colon. There are multiple calcifications projecting over the patient's pelvis. There is a 5 mm calcification in the right hemipelvis that could represent a phlebolith or distal right ureteral stone. The bowel gas pattern is nonobstructive. There is a single mildly dilated loop of small bowel in the left lower quadrant measuring approximately 3 cm IMPRESSION: 1. There are multiple calcifications projecting over the patient's pelvis. These are statistically most likely to represent phleboliths. However, there is a 5 mm calcification in the right hemipelvis that is suspicious for a distal right ureteral stone in the appropriate clinical setting. 2. Nonobstructive bowel gas pattern. 3. Moderate amount of stool in the ascending colon. Electronically Signed   By: Katherine Mantle M.D.   On: 08/21/2019 22:02   US Renal  Result Date: 08/21/2019 CLINICAL DATA:  Right flank pain. EXAM: RENAL / URINARY TRACT ULTRASOUND COMPLETE COMPARISON:  None. FINDINGS: Right Kidney: Renal measurements: 10.9 x 4.3 x 4.2 cm = volume: 115 mL. There is mild right-sided hydronephrosis. Left Kidney: Renal measurements: 11 x 4.9 x 4.9 cm = volume: 137 mL. Echogenicity within normal limits. No mass or hydronephrosis visualized. Bladder: Appears normal for degree of bladder distention. Both ureteral jets were visualized. IMPRESSION: 1. Mild right-sided hydronephrosis. 2. Both ureteral jets were visualized. Electronically Signed   By: Katherine Mantle M.D.   On: 08/21/2019 21:49    ____________________________________________   PROCEDURES  Procedure(s) performed:   Procedures  None  ____________________________________________   INITIAL IMPRESSION / ASSESSMENT AND PLAN / ED COURSE  Pertinent labs & imaging results that were available during my care of the patient were reviewed by me and considered in my medical decision making (see chart  for details).   Patient presents to the emergency department with acute onset right flank pain.  She is [redacted] weeks pregnant without vaginal bleeding or discharge.  By report, she has an intrauterine pregnancy found on bedside ultrasound at her OB office.  Suspect kidney stone clinically.  Very low suspicion for pyelonephritis given the acute onset of symptoms, lack of fever, no CVA tenderness.  Do plan to obtain screening blood work, renal ultrasound, KUB.   10:30 PM  Spoke with urology, Dr. Ilsa Iha, regarding the case.  Agrees with plan for discharge home with pain control, nausea control, antibiotics, with clear ED return precautions over the weekend.  I have placed an ambulatory referral and given contact information.  They will attempt to schedule the patient for the soonest available appointment early next week.  ____________________________________________  FINAL CLINICAL IMPRESSION(S) / ED DIAGNOSES  Final diagnoses:  Right ureteral stone  Acute cystitis without hematuria     MEDICATIONS GIVEN DURING THIS VISIT:  Medications  cefTRIAXone (ROCEPHIN) 1 g in sodium chloride 0.9 % 100 mL IVPB (0 g Intravenous Stopped 08/21/19 2148)  acetaminophen (TYLENOL) tablet 1,000 mg (1,000 mg Oral Given 08/21/19 2002)  ondansetron (ZOFRAN-ODT) disintegrating tablet 4 mg (4 mg Oral Given 08/21/19 2002)     NEW OUTPATIENT MEDICATIONS STARTED DURING THIS VISIT:  Discharge Medication List as of 08/21/2019 10:42 PM  START taking these medications   Details  cephALEXin (KEFLEX) 500 MG capsule Take 1 capsule (500 mg total) by mouth 4 (four) times daily for 7 days., Starting Fri 08/21/2019, Until Fri 08/28/2019, Normal    ondansetron (ZOFRAN ODT) 4 MG disintegrating tablet Take 1 tablet (4 mg total) by mouth every 8 (eight) hours as needed., Starting Fri 08/21/2019, Normal    oxyCODONE-acetaminophen (PERCOCET/ROXICET) 5-325 MG tablet Take 2 tablets by mouth every 4 (four) hours as needed for severe pain.,  Starting Fri 08/21/2019, Normal        Note:  This document was prepared using Dragon voice recognition software and may include unintentional dictation errors.  Alona BeneJoshua , MD, [facility]    , Arlyss RepressJoshua G, MD 08/22/19 [phone]

## 2019-08-21 NOTE — Discharge Instructions (Addendum)
You were seen in the emergency department today with right flank pain.  We found evidence of a kidney stone.  You also have evidence of a urinary tract infection.  We discharged you on antibiotics and control your pain and nausea at home.  I prescribed medications for this which you can pick up at the pharmacy.  Please call the urologist first thing on Monday.  I have placed a referral and I will be awaiting your call to schedule an urgent appointment.  If you develop worsening pain, vomiting, confusion, fever over the weekend you need to present to the emergency department immediately.

## 2019-08-23 LAB — URINE CULTURE
Culture: <10000 — AB
Special Requests: NORMAL

## 2019-08-24 DIAGNOSIS — Z3A08 8 weeks gestation of pregnancy: Secondary | ICD-10-CM | POA: Diagnosis not present

## 2019-08-24 DIAGNOSIS — O09521 Supervision of elderly multigravida, first trimester: Secondary | ICD-10-CM | POA: Diagnosis not present

## 2019-08-25 DIAGNOSIS — N201 Calculus of ureter: Secondary | ICD-10-CM | POA: Diagnosis not present

## 2019-08-25 DIAGNOSIS — N130 Hydronephrosis with ureteropelvic junction obstruction: Secondary | ICD-10-CM | POA: Diagnosis not present

## 2019-09-07 DIAGNOSIS — Z3689 Encounter for other specified antenatal screening: Secondary | ICD-10-CM | POA: Diagnosis not present

## 2019-09-07 DIAGNOSIS — Z36 Encounter for antenatal screening for chromosomal anomalies: Secondary | ICD-10-CM | POA: Diagnosis not present

## 2019-09-07 DIAGNOSIS — O09521 Supervision of elderly multigravida, first trimester: Secondary | ICD-10-CM | POA: Diagnosis not present

## 2019-09-15 DIAGNOSIS — O09521 Supervision of elderly multigravida, first trimester: Secondary | ICD-10-CM | POA: Diagnosis not present

## 2019-09-15 DIAGNOSIS — Z3A11 11 weeks gestation of pregnancy: Secondary | ICD-10-CM | POA: Diagnosis not present

## 2019-09-15 DIAGNOSIS — Z3689 Encounter for other specified antenatal screening: Secondary | ICD-10-CM | POA: Diagnosis not present

## 2019-09-28 DIAGNOSIS — O09521 Supervision of elderly multigravida, first trimester: Secondary | ICD-10-CM | POA: Diagnosis not present

## 2019-09-28 DIAGNOSIS — Z3A13 13 weeks gestation of pregnancy: Secondary | ICD-10-CM | POA: Diagnosis not present

## 2019-10-20 DIAGNOSIS — O09522 Supervision of elderly multigravida, second trimester: Secondary | ICD-10-CM | POA: Diagnosis not present

## 2019-10-20 DIAGNOSIS — Z361 Encounter for antenatal screening for raised alphafetoprotein level: Secondary | ICD-10-CM | POA: Diagnosis not present

## 2019-10-20 DIAGNOSIS — Z3A16 16 weeks gestation of pregnancy: Secondary | ICD-10-CM | POA: Diagnosis not present

## 2019-11-09 DIAGNOSIS — Z8759 Personal history of other complications of pregnancy, childbirth and the puerperium: Secondary | ICD-10-CM | POA: Diagnosis not present

## 2019-11-09 DIAGNOSIS — O09519 Supervision of elderly primigravida, unspecified trimester: Secondary | ICD-10-CM | POA: Diagnosis not present

## 2019-11-09 DIAGNOSIS — Z3A19 19 weeks gestation of pregnancy: Secondary | ICD-10-CM | POA: Diagnosis not present

## 2019-11-09 DIAGNOSIS — O09522 Supervision of elderly multigravida, second trimester: Secondary | ICD-10-CM | POA: Diagnosis not present

## 2019-12-07 DIAGNOSIS — O09522 Supervision of elderly multigravida, second trimester: Secondary | ICD-10-CM | POA: Diagnosis not present

## 2019-12-07 DIAGNOSIS — O10012 Pre-existing essential hypertension complicating pregnancy, second trimester: Secondary | ICD-10-CM | POA: Diagnosis not present

## 2019-12-07 DIAGNOSIS — Z3A23 23 weeks gestation of pregnancy: Secondary | ICD-10-CM | POA: Diagnosis not present

## 2019-12-21 DIAGNOSIS — O10012 Pre-existing essential hypertension complicating pregnancy, second trimester: Secondary | ICD-10-CM | POA: Diagnosis not present

## 2019-12-21 DIAGNOSIS — Z3A25 25 weeks gestation of pregnancy: Secondary | ICD-10-CM | POA: Diagnosis not present

## 2019-12-21 DIAGNOSIS — O09522 Supervision of elderly multigravida, second trimester: Secondary | ICD-10-CM | POA: Diagnosis not present

## 2020-01-05 DIAGNOSIS — Z3689 Encounter for other specified antenatal screening: Secondary | ICD-10-CM | POA: Diagnosis not present

## 2020-01-05 DIAGNOSIS — Z3A27 27 weeks gestation of pregnancy: Secondary | ICD-10-CM | POA: Diagnosis not present

## 2020-01-05 DIAGNOSIS — O09522 Supervision of elderly multigravida, second trimester: Secondary | ICD-10-CM | POA: Diagnosis not present

## 2020-01-05 DIAGNOSIS — O10012 Pre-existing essential hypertension complicating pregnancy, second trimester: Secondary | ICD-10-CM | POA: Diagnosis not present

## 2020-01-15 ENCOUNTER — Inpatient Hospital Stay (HOSPITAL_COMMUNITY)
Admission: AD | Admit: 2020-01-15 | Discharge: 2020-01-15 | Disposition: A | Discharge status: Home / Self Care | Payer: BC Managed Care – PPO | Attending: Obstetrics and Gynecology | Admitting: Obstetrics and Gynecology

## 2020-01-15 ENCOUNTER — Encounter (HOSPITAL_COMMUNITY): Payer: Self-pay | Admitting: Obstetrics and Gynecology

## 2020-01-15 ENCOUNTER — Other Ambulatory Visit: Payer: Self-pay

## 2020-01-15 DIAGNOSIS — Z3A28 28 weeks gestation of pregnancy: Secondary | ICD-10-CM | POA: Diagnosis not present

## 2020-01-15 DIAGNOSIS — O10013 Pre-existing essential hypertension complicating pregnancy, third trimester: Secondary | ICD-10-CM | POA: Insufficient documentation

## 2020-01-15 DIAGNOSIS — O10913 Unspecified pre-existing hypertension complicating pregnancy, third trimester: Secondary | ICD-10-CM | POA: Diagnosis not present

## 2020-01-15 DIAGNOSIS — O09523 Supervision of elderly multigravida, third trimester: Secondary | ICD-10-CM | POA: Insufficient documentation

## 2020-01-15 DIAGNOSIS — R030 Elevated blood-pressure reading, without diagnosis of hypertension: Secondary | ICD-10-CM | POA: Diagnosis not present

## 2020-01-15 DIAGNOSIS — O10012 Pre-existing essential hypertension complicating pregnancy, second trimester: Secondary | ICD-10-CM | POA: Diagnosis not present

## 2020-01-15 DIAGNOSIS — O10919 Unspecified pre-existing hypertension complicating pregnancy, unspecified trimester: Secondary | ICD-10-CM

## 2020-01-15 HISTORY — DX: Depression, unspecified: F32.A

## 2020-01-15 HISTORY — DX: Gestational (pregnancy-induced) hypertension without significant proteinuria, unspecified trimester: O13.9

## 2020-01-15 LAB — CBC
HCT: 37 % (ref 36.0–46.0)
Hemoglobin: 12.7 g/dL (ref 12.0–15.0)
MCH: 31.7 pg (ref 26.0–34.0)
MCHC: 34.3 g/dL (ref 30.0–36.0)
MCV: 92.3 fL (ref 80.0–100.0)
Platelets: 195 10*3/uL (ref 150–400)
RBC: 4.01 MIL/uL (ref 3.87–5.11)
RDW: 12.7 % (ref 11.5–15.5)
WBC: 7.1 10*3/uL (ref 4.0–10.5)
nRBC: 0 % (ref 0.0–0.2)

## 2020-01-15 LAB — COMPREHENSIVE METABOLIC PANEL
ALT: 12 U/L (ref 0–44)
AST: 15 U/L (ref 15–41)
Albumin: 2.9 g/dL — ABNORMAL LOW (ref 3.5–5.0)
Alkaline Phosphatase: 75 U/L (ref 38–126)
Anion gap: 9 (ref 5–15)
BUN: 7 mg/dL (ref 6–20)
CO2: 21 mmol/L — ABNORMAL LOW (ref 22–32)
Calcium: 9.6 mg/dL (ref 8.9–10.3)
Chloride: 106 mmol/L (ref 98–111)
Creatinine, Ser: 0.45 mg/dL (ref 0.44–1.00)
GFR calc Af Amer: >60 mL/min (ref >60)
GFR calc non Af Amer: >60 mL/min (ref >60)
Glucose, Bld: 104 mg/dL — ABNORMAL HIGH (ref 70–99)
Potassium: 3.7 mmol/L (ref 3.5–5.1)
Sodium: 136 mmol/L (ref 135–145)
Total Bilirubin: 0.5 mg/dL (ref 0.3–1.2)
Total Protein: 6.5 g/dL (ref 6.5–8.1)

## 2020-01-15 LAB — PROTEIN / CREATININE RATIO, URINE
Creatinine, Urine: 140.21 mg/dL
Protein Creatinine Ratio: 0.16 mg/mg{Cre} — ABNORMAL HIGH (ref 0.00–0.15)
Total Protein, Urine: 23 mg/dL

## 2020-01-15 MED ORDER — LABETALOL HCL 100 MG PO TABS: 200.0000 mg | ORAL_TABLET | Freq: Two times a day (BID) | ORAL | 0 refills | Status: DC

## 2020-01-15 MED ORDER — LABETALOL HCL 5 MG/ML IV SOLN: 80.0000 mg | INTRAVENOUS | Status: DC | PRN

## 2020-01-15 MED ORDER — HYDRALAZINE HCL 20 MG/ML IJ SOLN: 10.0000 mg | INTRAMUSCULAR | Status: DC | PRN

## 2020-01-15 MED ORDER — LABETALOL HCL 5 MG/ML IV SOLN: 40.0000 mg | INTRAVENOUS | Status: DC | PRN

## 2020-01-15 MED ORDER — LABETALOL HCL 5 MG/ML IV SOLN: 20.0000 mg | INTRAVENOUS | Status: DC | PRN

## 2020-01-15 NOTE — MAU Provider Note (Signed)
History     CSN: [id number]  Arrival date and time: 01/15/20 1107   First Provider Initiated Contact with Patient 01/15/20 1150      Chief Complaint  Patient presents with  . Hypertension   HPI Allison Riggs is a 34 y.o. [phone] at [redacted]w[redacted]d who presents to MAU from clinic for evaluation of severe range blood pressures on her home cuff last night and in clinic this morning. Patient endorses 2-3 day history of scotomata. She denies headache, RUQ/epigastric pain, new onset swelling or weight gain. Her pregnancy is complicated by Chronic Hypertension, not on medication. She denies contractions, vaginal bleeding, leaking of fluid, decreased fetal movement, fever, falls, or recent illness.   Ob history includes IOL at 33 weeks for severe Preeclampsia. Patient is compliant with daily bASA 81mg .  Patient receives prenatal care with [facility], Dr. is her primary.  OB History    Gravida  4   Para  3   Term  1   Preterm  2   AB  0   Living  3     SAB  0   TAB  0   Ectopic  0   Multiple  0   Live Births  3           Past Medical History:  Diagnosis Date  . Anxiety   . Depression   . Pregnancy induced hypertension     Past Surgical History:  Procedure Laterality Date  . WISDOM TOOTH EXTRACTION      History reviewed. No pertinent family history.  Social History   Tobacco Use  . Smoking status: Never Smoker  . Smokeless tobacco: Never Used  Substance Use Topics  . Alcohol use: Not Currently    Comment: not in pregnancy  . Drug use: Not Currently    Allergies: No Known Allergies  Medications Prior to Admission  Medication Sig Dispense Refill Last Dose  . aspirin 81 MG chewable tablet Chew by mouth daily.   01/14/2020 at Unknown time  . cetirizine (ZYRTEC) 10 MG tablet Take 10 mg by mouth daily.   01/14/2020 at Unknown time  . escitalopram (LEXAPRO) 20 MG tablet Take 40 mg by mouth daily.   01/14/2020 at Unknown time  . Prenatal Vit-Fe Fumarate-FA  (PRENATAL MULTIVITAMIN) TABS tablet Take 1 tablet by mouth daily at 12 noon.   01/14/2020 at Unknown time  . buPROPion (WELLBUTRIN XL) 150 MG 24 hr tablet Take 150 mg by mouth daily.     . ondansetron (ZOFRAN ODT) 4 MG disintegrating tablet Take 1 tablet (4 mg total) by mouth every 8 (eight) hours as needed. 20 tablet 0   . oxyCODONE-acetaminophen (PERCOCET/ROXICET) 5-325 MG tablet Take 2 tablets by mouth every 4 (four) hours as needed for severe pain. 15 tablet 0     Review of Systems  Constitutional: Negative for chills, fatigue and fever.  Eyes: Positive for visual disturbance. Negative for photophobia.  Respiratory: Negative for shortness of breath.   Gastrointestinal: Negative for abdominal pain.  Genitourinary: Negative for vaginal bleeding.  Musculoskeletal: Negative for back pain.  Neurological: Negative for dizziness, syncope, weakness and headaches.  All other systems reviewed and are negative.  Physical Exam   Blood pressure (!) 145/98, pulse 91, temperature 98.5 F (36.9 C), temperature source Oral, resp. rate 18, SpO2 98 %.  Physical Exam  Nursing note and vitals reviewed. Constitutional: She is oriented to person, place, and time. She appears well-developed and well-nourished.  Cardiovascular: Normal rate and normal  heart sounds.  Respiratory: Effort normal and breath sounds normal. No respiratory distress.  GI: Soft. She exhibits no distension. There is no abdominal tenderness. There is no rebound and no guarding.  Gravid  Musculoskeletal:        General: Normal range of motion.  Neurological: She is alert and oriented to person, place, and time.  Skin: Skin is warm and dry.  Psychiatric: She has a normal mood and affect. Her behavior is normal. Judgment and thought content normal.    MAU Course  Procedures  --Reactive tracing: baseline 140, mod var, pos accels, no decels --Toco: occasional UI, otherwise quiet --Discussed patient report of scotomata with Dr.  Macon Large at 11:58am and again once labs resulted. Discussion with Dr. Billy Coast advised. Per Dr. Billy Coast, patient eligible for introduction of Labetalol with close follow-up Monday 01/18/2020.  Patient Vitals for the past 24 hrs:  BP Temp Temp src Pulse Resp SpO2  01/15/20 1300 (!) 137/92 -- -- 79 16 --  01/15/20 1245 (!) 143/96 -- -- 84 -- --  01/15/20 1230 (!) 142/97 -- -- 81 -- --  01/15/20 1215 (!) 146/99 -- -- 86 -- --  01/15/20 1200 (!) 148/100 -- -- 93 -- --  01/15/20 1145 (!) 145/98 -- -- 91 -- 98 %  01/15/20 1140 -- -- -- -- -- 99 %  01/15/20 1135 (!) 144/101 98.5 F (36.9 C) Oral 97 18 --   Results for orders placed or performed during the hospital encounter of 01/15/20 (from the past 24 hour(s))  Protein / creatinine ratio, urine     Status: Abnormal   Collection Time: 01/15/20 11:25 AM  Result Value Ref Range   Creatinine, Urine 140.21 mg/dL   Total Protein, Urine 23 mg/dL   Protein Creatinine Ratio 0.16 (H) 0.00 - 0.15 mg/mg[Cre]  CBC     Status: None   Collection Time: 01/15/20 11:48 AM  Result Value Ref Range   WBC 7.1 4.0 - 10.5 K/uL   RBC 4.01 3.87 - 5.11 MIL/uL   Hemoglobin 12.7 12.0 - 15.0 g/dL   HCT 76.1 60.7 - 37.1 %   MCV 92.3 80.0 - 100.0 fL   MCH 31.7 26.0 - 34.0 pg   MCHC 34.3 30.0 - 36.0 g/dL   RDW 06.2 69.4 - 85.4 %   Platelets 195 150 - 400 K/uL   nRBC 0.0 0.0 - 0.2 %  Comprehensive metabolic panel     Status: Abnormal   Collection Time: 01/15/20 11:48 AM  Result Value Ref Range   Sodium 136 135 - 145 mmol/L   Potassium 3.7 3.5 - 5.1 mmol/L   Chloride 106 98 - 111 mmol/L   CO2 21 (L) 22 - 32 mmol/L   Glucose, Bld 104 (H) 70 - 99 mg/dL   BUN 7 6 - 20 mg/dL   Creatinine, Ser 6.27 0.44 - 1.00 mg/dL   Calcium 9.6 8.9 - 03.5 mg/dL   Total Protein 6.5 6.5 - 8.1 g/dL   Albumin 2.9 (L) 3.5 - 5.0 g/dL   AST 15 15 - 41 U/L   ALT 12 0 - 44 U/L   Alkaline Phosphatase 75 38 - 126 U/L   Total Bilirubin 0.5 0.3 - 1.2 mg/dL   GFR calc non Af Amer >60 >60  mL/min   GFR calc Af Amer >60 >60 mL/min   Anion gap 9 5 - 15   Meds ordered this encounter  Medications  . labetalol (NORMODYNE) 100 MG tablet    Sig: Take  2 tablets (200 mg total) by mouth 2 (two) times daily.    Dispense:  30 tablet    Refill:  0    Order Specific Question:   Supervising Provider    Answer:   Verita Schneiders A [[id number]]   Assessment and Plan  --34 y.o. [phone] at [redacted]w[redacted]d  --Reactive tracing --Chronic Hypertension, normal PEC labs, no severe range in MAU --Initiate Labetalol as above --Discharge home in stable condition with strict return precautions  F/U: --Present to [facility] for bp check and repeat labs 01/18/2020. Voicemail left and note forwarded  Darlina Rumpf, CNM 01/15/2020, 2:37 PM

## 2020-01-15 NOTE — Discharge Instructions (Signed)

## 2020-01-15 NOTE — MAU Note (Signed)
Printed AVS given to patient.  Discussed s/s of PreE.  Pt verbalized understanding.  Dc'd home in good condition.

## 2020-01-15 NOTE — MAU Note (Signed)
PT took BP at home 160s/110s.  Denies HA.  Seeing floaters.  Reports good fetal movement.  Denies vaginal bleeding or LOF.  Had PreE with last baby.

## 2020-01-18 ENCOUNTER — Encounter: Payer: Self-pay | Admitting: *Deleted

## 2020-01-18 DIAGNOSIS — Z3A29 29 weeks gestation of pregnancy: Secondary | ICD-10-CM | POA: Diagnosis not present

## 2020-01-18 DIAGNOSIS — O10012 Pre-existing essential hypertension complicating pregnancy, second trimester: Secondary | ICD-10-CM | POA: Diagnosis not present

## 2020-01-19 DIAGNOSIS — Z8759 Personal history of other complications of pregnancy, childbirth and the puerperium: Secondary | ICD-10-CM | POA: Diagnosis not present

## 2020-01-19 DIAGNOSIS — Z3689 Encounter for other specified antenatal screening: Secondary | ICD-10-CM | POA: Diagnosis not present

## 2020-01-22 DIAGNOSIS — Z3A29 29 weeks gestation of pregnancy: Secondary | ICD-10-CM | POA: Diagnosis not present

## 2020-01-22 DIAGNOSIS — O10013 Pre-existing essential hypertension complicating pregnancy, third trimester: Secondary | ICD-10-CM | POA: Diagnosis not present

## 2020-01-28 DIAGNOSIS — Z3A30 30 weeks gestation of pregnancy: Secondary | ICD-10-CM | POA: Diagnosis not present

## 2020-01-28 DIAGNOSIS — O133 Gestational [pregnancy-induced] hypertension without significant proteinuria, third trimester: Secondary | ICD-10-CM | POA: Diagnosis not present

## 2020-02-01 DIAGNOSIS — O133 Gestational [pregnancy-induced] hypertension without significant proteinuria, third trimester: Secondary | ICD-10-CM | POA: Diagnosis not present

## 2020-02-01 DIAGNOSIS — Z3A31 31 weeks gestation of pregnancy: Secondary | ICD-10-CM | POA: Diagnosis not present

## 2020-02-04 DIAGNOSIS — Z3A31 31 weeks gestation of pregnancy: Secondary | ICD-10-CM | POA: Diagnosis not present

## 2020-02-04 DIAGNOSIS — O133 Gestational [pregnancy-induced] hypertension without significant proteinuria, third trimester: Secondary | ICD-10-CM | POA: Diagnosis not present

## 2020-02-05 DIAGNOSIS — Z3A37 37 weeks gestation of pregnancy: Secondary | ICD-10-CM | POA: Diagnosis not present

## 2020-02-05 DIAGNOSIS — O133 Gestational [pregnancy-induced] hypertension without significant proteinuria, third trimester: Secondary | ICD-10-CM | POA: Diagnosis not present

## 2020-02-08 DIAGNOSIS — Z3A32 32 weeks gestation of pregnancy: Secondary | ICD-10-CM | POA: Diagnosis not present

## 2020-02-08 DIAGNOSIS — O133 Gestational [pregnancy-induced] hypertension without significant proteinuria, third trimester: Secondary | ICD-10-CM | POA: Diagnosis not present

## 2020-02-11 DIAGNOSIS — Z3A32 32 weeks gestation of pregnancy: Secondary | ICD-10-CM | POA: Diagnosis not present

## 2020-02-11 DIAGNOSIS — O133 Gestational [pregnancy-induced] hypertension without significant proteinuria, third trimester: Secondary | ICD-10-CM | POA: Diagnosis not present

## 2020-02-11 DIAGNOSIS — O113 Pre-existing hypertension with pre-eclampsia, third trimester: Secondary | ICD-10-CM | POA: Diagnosis not present

## 2020-02-16 DIAGNOSIS — O1493 Unspecified pre-eclampsia, third trimester: Secondary | ICD-10-CM | POA: Diagnosis not present

## 2020-02-16 DIAGNOSIS — Z3A33 33 weeks gestation of pregnancy: Secondary | ICD-10-CM | POA: Diagnosis not present

## 2020-02-16 DIAGNOSIS — Z3682 Encounter for antenatal screening for nuchal translucency: Secondary | ICD-10-CM | POA: Diagnosis not present

## 2020-02-16 DIAGNOSIS — O133 Gestational [pregnancy-induced] hypertension without significant proteinuria, third trimester: Secondary | ICD-10-CM | POA: Diagnosis not present

## 2020-02-17 ENCOUNTER — Encounter (HOSPITAL_COMMUNITY): Payer: Self-pay | Admitting: Obstetrics and Gynecology

## 2020-02-17 ENCOUNTER — Inpatient Hospital Stay (HOSPITAL_BASED_OUTPATIENT_CLINIC_OR_DEPARTMENT_OTHER): Payer: BC Managed Care – PPO

## 2020-02-17 ENCOUNTER — Inpatient Hospital Stay (HOSPITAL_COMMUNITY)
Admission: AD | Admit: 2020-02-17 | Discharge: 2020-02-24 | DRG: 788 | Disposition: A | Discharge status: Home / Self Care | Payer: BC Managed Care – PPO | Attending: Obstetrics and Gynecology | Admitting: Obstetrics and Gynecology

## 2020-02-17 ENCOUNTER — Other Ambulatory Visit: Payer: Self-pay

## 2020-02-17 ENCOUNTER — Encounter (HOSPITAL_COMMUNITY): Payer: BC Managed Care – PPO

## 2020-02-17 DIAGNOSIS — O113 Pre-existing hypertension with pre-eclampsia, third trimester: Secondary | ICD-10-CM | POA: Diagnosis not present

## 2020-02-17 DIAGNOSIS — Z3A33 33 weeks gestation of pregnancy: Secondary | ICD-10-CM

## 2020-02-17 DIAGNOSIS — Z362 Encounter for other antenatal screening follow-up: Secondary | ICD-10-CM | POA: Diagnosis not present

## 2020-02-17 DIAGNOSIS — O1495 Unspecified pre-eclampsia, complicating the puerperium: Secondary | ICD-10-CM | POA: Diagnosis present

## 2020-02-17 DIAGNOSIS — Z20822 Contact with and (suspected) exposure to covid-19: Secondary | ICD-10-CM | POA: Diagnosis not present

## 2020-02-17 DIAGNOSIS — O10013 Pre-existing essential hypertension complicating pregnancy, third trimester: Secondary | ICD-10-CM | POA: Diagnosis not present

## 2020-02-17 DIAGNOSIS — O10913 Unspecified pre-existing hypertension complicating pregnancy, third trimester: Secondary | ICD-10-CM

## 2020-02-17 DIAGNOSIS — O411230 Chorioamnionitis, third trimester, not applicable or unspecified: Secondary | ICD-10-CM | POA: Diagnosis not present

## 2020-02-17 DIAGNOSIS — F329 Major depressive disorder, single episode, unspecified: Secondary | ICD-10-CM | POA: Diagnosis not present

## 2020-02-17 DIAGNOSIS — F411 Generalized anxiety disorder: Secondary | ICD-10-CM | POA: Diagnosis present

## 2020-02-17 DIAGNOSIS — O1415 Severe pre-eclampsia, complicating the puerperium: Secondary | ICD-10-CM | POA: Diagnosis not present

## 2020-02-17 DIAGNOSIS — O1002 Pre-existing essential hypertension complicating childbirth: Secondary | ICD-10-CM | POA: Diagnosis not present

## 2020-02-17 DIAGNOSIS — O1490 Unspecified pre-eclampsia, unspecified trimester: Secondary | ICD-10-CM

## 2020-02-17 DIAGNOSIS — O365930 Maternal care for other known or suspected poor fetal growth, third trimester, not applicable or unspecified: Secondary | ICD-10-CM | POA: Diagnosis not present

## 2020-02-17 DIAGNOSIS — O1493 Unspecified pre-eclampsia, third trimester: Secondary | ICD-10-CM

## 2020-02-17 DIAGNOSIS — O99344 Other mental disorders complicating childbirth: Secondary | ICD-10-CM | POA: Diagnosis not present

## 2020-02-17 DIAGNOSIS — O1414 Severe pre-eclampsia complicating childbirth: Secondary | ICD-10-CM | POA: Diagnosis not present

## 2020-02-17 DIAGNOSIS — O114 Pre-existing hypertension with pre-eclampsia, complicating childbirth: Principal | ICD-10-CM | POA: Diagnosis present

## 2020-02-17 DIAGNOSIS — O1413 Severe pre-eclampsia, third trimester: Secondary | ICD-10-CM | POA: Diagnosis not present

## 2020-02-17 LAB — DIFFERENTIAL
Abs Immature Granulocytes: 0.03 10*3/uL (ref 0.00–0.07)
Basophils Absolute: 0 10*3/uL (ref 0.0–0.1)
Basophils Relative: 1 %
Eosinophils Absolute: 0 10*3/uL (ref 0.0–0.5)
Eosinophils Relative: 0 %
Immature Granulocytes: 1 %
Lymphocytes Relative: 23 %
Lymphs Abs: 1.4 10*3/uL (ref 0.7–4.0)
Monocytes Absolute: 0.4 10*3/uL (ref 0.1–1.0)
Monocytes Relative: 7 %
Neutro Abs: 4.2 10*3/uL (ref 1.7–7.7)
Neutrophils Relative %: 68 %

## 2020-02-17 LAB — CBC
HCT: 36.7 % (ref 36.0–46.0)
Hemoglobin: 12.1 g/dL (ref 12.0–15.0)
MCH: 30.9 pg (ref 26.0–34.0)
MCHC: 33 g/dL (ref 30.0–36.0)
MCV: 93.6 fL (ref 80.0–100.0)
Platelets: 147 10*3/uL — ABNORMAL LOW (ref 150–400)
RBC: 3.92 MIL/uL (ref 3.87–5.11)
RDW: 12.7 % (ref 11.5–15.5)
WBC: 6.1 10*3/uL (ref 4.0–10.5)
nRBC: 0 % (ref 0.0–0.2)

## 2020-02-17 LAB — COMPREHENSIVE METABOLIC PANEL
ALT: 18 U/L (ref 0–44)
AST: 18 U/L (ref 15–41)
Albumin: 2.3 g/dL — ABNORMAL LOW (ref 3.5–5.0)
Alkaline Phosphatase: 96 U/L (ref 38–126)
Anion gap: 10 (ref 5–15)
BUN: 16 mg/dL (ref 6–20)
CO2: 19 mmol/L — ABNORMAL LOW (ref 22–32)
Calcium: 8.5 mg/dL — ABNORMAL LOW (ref 8.9–10.3)
Chloride: 106 mmol/L (ref 98–111)
Creatinine, Ser: 0.65 mg/dL (ref 0.44–1.00)
GFR calc Af Amer: >60 mL/min (ref >60)
GFR calc non Af Amer: >60 mL/min (ref >60)
Glucose, Bld: 114 mg/dL — ABNORMAL HIGH (ref 70–99)
Potassium: 3.8 mmol/L (ref 3.5–5.1)
Sodium: 135 mmol/L (ref 135–145)
Total Bilirubin: 0.5 mg/dL (ref 0.3–1.2)
Total Protein: 5.1 g/dL — ABNORMAL LOW (ref 6.5–8.1)

## 2020-02-17 LAB — SARS CORONAVIRUS 2 (TAT 6-24 HRS): SARS Coronavirus 2: NEGATIVE

## 2020-02-17 MED ORDER — LABETALOL HCL 5 MG/ML IV SOLN
20.0000 mg | Freq: Once | INTRAVENOUS | Status: AC
  Administered 2020-02-17: 20 mg via INTRAVENOUS

## 2020-02-17 MED ORDER — LABETALOL HCL 200 MG PO TABS
300.0000 mg | ORAL_TABLET | Freq: Two times a day (BID) | ORAL | Status: DC
  Administered 2020-02-17: 300 mg via ORAL
  Filled 2020-02-17: qty 1

## 2020-02-17 MED ORDER — BETAMETHASONE SOD PHOS & ACET 6 (3-3) MG/ML IJ SUSP
12.0000 mg | Freq: Once | INTRAMUSCULAR | Status: AC
  Administered 2020-02-17: 19:00:00 12 mg via INTRAMUSCULAR
  Filled 2020-02-17: qty 5

## 2020-02-17 MED ORDER — NIFEDIPINE ER OSMOTIC RELEASE 30 MG PO TB24
30.0000 mg | ORAL_TABLET | Freq: Two times a day (BID) | ORAL | Status: DC
  Administered 2020-02-18 – 2020-02-19 (×3): 30 mg via ORAL
  Filled 2020-02-17 (×3): qty 1

## 2020-02-17 MED ORDER — LABETALOL HCL 200 MG PO TABS: 400.0000 mg | ORAL_TABLET | Freq: Every day | ORAL | Status: DC

## 2020-02-17 MED ORDER — ESCITALOPRAM OXALATE 10 MG PO TABS
40.0000 mg | ORAL_TABLET | Freq: Every day | ORAL | Status: DC
  Administered 2020-02-17 – 2020-02-18 (×2): 40 mg via ORAL
  Filled 2020-02-17 (×2): qty 4

## 2020-02-17 MED ORDER — LABETALOL HCL 200 MG PO TABS
400.0000 mg | ORAL_TABLET | Freq: Once | ORAL | Status: AC
  Administered 2020-02-17: 400 mg via ORAL
  Filled 2020-02-17: qty 2

## 2020-02-17 MED ORDER — NIFEDIPINE ER OSMOTIC RELEASE 30 MG PO TB24
30.0000 mg | ORAL_TABLET | Freq: Two times a day (BID) | ORAL | Status: DC
  Administered 2020-02-17: 30 mg via ORAL
  Filled 2020-02-17: qty 1

## 2020-02-17 MED ORDER — LABETALOL HCL 200 MG PO TABS
300.0000 mg | ORAL_TABLET | Freq: Two times a day (BID) | ORAL | Status: DC
  Administered 2020-02-18: 300 mg via ORAL
  Filled 2020-02-17: qty 1

## 2020-02-17 MED ORDER — LABETALOL HCL 5 MG/ML IV SOLN
INTRAVENOUS | Status: AC
  Filled 2020-02-17: qty 4

## 2020-02-17 NOTE — Consult Note (Addendum)
MFM Note  Allison Riggs was seen in consultation due to a history of chronic hypertension with superimposed preeclampsia.  The patient is a gravida 4 para 0-1-2-3 at 33 weeks and 3 days.  She was started on labetalol for blood pressure control at around 28 weeks and was subsequently found to have significant proteinuria at around 30 weeks indicating that she had developed preeclampsia.  Nifedipine was added for blood pressure control about 1 week ago.  She is currently asymptomatic and denies any signs or symptoms of preeclampsia.  Her preeclampsia labs which was followed as an outpatient have all been within normal limits.  She received an initial course of antenatal corticosteroids on March 25 and February 05, 2020.  The patient was admitted to the hospital today as she had a biophysical profile performed yesterday that was 6 out of 10 with -2 for absent fetal breathing movements and a nonreactive nonstress test.  She then had another biophysical profile today that was 6 out of 10 again.  Absent fetal breathing movements were noted again today.  Her NST today showed rare variable decelerations.  Her blood pressures currently are in the 140s to 150s over 90s to 100s range.  Her PIH labs today were all within normal limits.  The patient has a history of severe preeclampsia during her last pregnancy requiring a 33-week delivery  in 2019.  She has another pregnancy in 2012 that was complicated by IUGR requiring a 36-week delivery.  Her first pregnancy in 2009 was a full-term vaginal delivery that was complicated by hypertension.  Her current medications include nifedipine 30 mg XL twice a day, labetalol 200 mg in the morning, 300 mg in the afternoon, and 200 mg in the evening, Lexapro 40 mg daily, Prilosec as needed, Wellbutrin, a daily baby aspirin, and prenatal vitamins.  Other than hypertension, she denies any other significant past medical history.  The implications and management of chronic hypertension  with superimposed preeclampsia was discussed with the patient.  She was advised that preeclampsia can affect both the mother and the fetus.  In the mother, preeclampsia may cause a rise in blood pressures and it can affect the mother's kidney, liver and platelet functions.  It may also cause the mother to have seizures.  In the fetus, it may cause growth restriction and oligohydramnios.  She understands that delivery is the only treatment for preeclampsia.  As her fetal status is currently reassuring and as she is currently asymptomatic with stable blood pressures, I would recommend that she continue inpatient monitoring for now.  She should continue to be treated with labetalol and nifedipine for blood pressure control.  As it has been over 7 days since she received her initial course of antenatal corticosteroids, a rescue course of steroids should be administered now.  The patient should have a growth ultrasound ordered to evaluate the fetal growth and amniotic fluid levels.  Due to severe preeclampsia with borderline fetal status, I would recommend delivery at around 34 weeks.    An earlier delivery may be indicated should her blood pressures be persistently in the 150s over 100's range despite medication treatment, should she require IV antihypertensive medications for blood pressure control, should she develop any signs or symptoms of severe preeclampsia, or at any time should there be nonreassuring fetal status.  The patient understands that her baby will require a NICU admission should she require delivery prior to 36 weeks.  At the end of the consultation, the patient stated that all  of her questions have been answered to her complete satisfaction.    Thank you for referring this very nice patient for Maternal-Fetal Medicine consultation.  Recommendations:  Observation in the hospital to monitor blood pressures and fetal status  Continue labetalol and nifedipine for blood pressure  control  Administer rescue course of steroids  Fetal growth ultrasound  Delivery at around 34 weeks  Delivery prior to 34 weeks would be indicated once she completes her rescue course of steroids -Should her blood pressures be persistently in the 150s over 100s range  -Should she require IV antihypertensive medications for blood pressure control -Should she develop any signs or symptoms of severe preeclampsia -Should there be nonreassuring fetal status  Magnesium sulfate should be given for maternal seizure prophylaxis after delivery

## 2020-02-17 NOTE — Progress Notes (Signed)
Patient ID: Florice Hindle, female   DOB: 10-03-1986, 34 y.o.   MRN: [id number] No complaints. No headaches, CP or SOB No epigastric pain. BPs elevated but meds have been dosed incorrectly per pharmacy. Will adjust doses to reflect dosing at home. CBC    Component Value Date/Time   WBC 6.1 02/17/2020 1447   RBC 3.92 02/17/2020 1447   HGB 12.1 02/17/2020 1447   HCT 36.7 02/17/2020 1447   PLT 147 (L) 02/17/2020 1447   MCV 93.6 02/17/2020 1447   MCH 30.9 02/17/2020 1447   MCHC 33.0 02/17/2020 1447   RDW 12.7 02/17/2020 1447   LYMPHSABS 1.4 02/17/2020 1447   MONOABS 0.4 02/17/2020 1447   EOSABS 0.0 02/17/2020 1447   BASOSABS 0.0 02/17/2020 1447   CMP     Component Value Date/Time   NA 135 02/17/2020 1617   K 3.8 02/17/2020 1617   CL 106 02/17/2020 1617   CO2 19 (L) 02/17/2020 1617   GLUCOSE 114 (H) 02/17/2020 1617   BUN 16 02/17/2020 1617   CREATININE 0.65 02/17/2020 1617   CREATININE 0.74 05/08/2016 1907   CALCIUM 8.5 (L) 02/17/2020 1617   PROT 5.1 (L) 02/17/2020 1617   ALBUMIN 2.3 (L) 02/17/2020 1617   AST 18 02/17/2020 1617   ALT 18 02/17/2020 1617   ALKPHOS 96 02/17/2020 1617   BILITOT 0.5 02/17/2020 1617   GFRNONAA >60 02/17/2020 1617   GFRNONAA >89 05/08/2016 1907   GFRAA >60 02/17/2020 1617   GFRAA >89 05/08/2016 1907   MFM sono c/w IUGR, nl afi and elevated S/D ratio, No absent or reversed EDF per verbal report.  FHR 130-140 with 15 x 15 accels and no recurrent decels.  Will administer rescue dose BMZ and proceed with delivery. Plan discussed with Dr. Parke Poisson.

## 2020-02-17 NOTE — H&P (Signed)
NAMEJAQUELINNE, Riggs MEDICAL RECORD WG:[id number] ACCOUNT [id number] DATE OF BIRTH:February 01, 1986 FACILITY: [facility] LOCATION: [facility] PHYSICIAN:Hue Steveson J. Billy Coast, MD  HISTORY AND PHYSICAL  DATE OF ADMISSION:  02/17/2020  CHIEF COMPLAINT:  Preeclampsia and BPP of 6/10.  HISTORY OF PRESENT ILLNESS:  The patient is a 34 year old white female G4, P0123 at 33 weeks and 3 days with known preeclampsia without severe features, who presents now for admission.  The patient's pregnancy has been complicated by onset of  hypertension at 108 weeks' gestation where she presented with 1 severe BP at that time and was started on labetalol. SHe subsequently has had labile hypertension, developed proteinuria consistent with preeclampsia at approximately 30 weeks.  Nifedipine was added for blood pressure  control  approximately 1 week ago.  The patient has had serial laboratory monitoring and was normal.  CBC and CMP done on 02/16/2020.  She denies headaches, chest pain, epigastric pain, nausea or vomiting.  She reports good fetal movement.  She denies bleeding or leakage of fluid.  The patient had a biophysical profile done in the office 04/06 with a BPP of 6/10 with -2 for breathing and a nonreactive but reassuring NST.  Presented 24 hours later for repeat testing consistent with 6/10.  Rare variable deceleration noted on NST, -2 for breathing on BPP.  She is now for admission as per discussion with MFM for serial monitoring, blood pressure monitoring, and possible delivery.  The patient did receive betamethasone on 02/04/2020 and  02/05/2020.  PAST MEDICAL HISTORY:  Reveals noncontributory.  SOCIAL HISTORY:  A family history of diabetes, hypertension, lymphoma.  No known drug allergies.  She has a personal history of hypertension and depression.  MEDICATIONS:  Nifedipine XL 30 mg b.i.d., labetalol taken 200 mg in the morning, 300 in the afternoon and 200 in the evening, Lexapro 20 mg x2, 40 at bedtime, Prilosec p.r.n.,  Wellbutrin 150 q. day, baby aspirin 81 mg per day and a prenatal vitamin.  PREGNANCY HISTORY:  Remarkable for a scheduled preterm birth at 10 weeks in 2019 due to severe preeclampsia, an induction at 36 weeks for IUGR in 2012, and a full-term vaginal delivery in 2009 with hypertension.  She has no other surgical interventions.  SOCIAL HISTORY:  She is a nonsmoker, nondrinker.  She denies domestic or physical violence.  Prenatal course as noted.  PHYSICAL EXAMINATION: GENERAL:  She is a well-developed, well-nourished white female in no acute distress.  Blood pressures in the office today of 135/88 and 138/89. HEENT:  Normal. NECK:  Supple, full range of motion. LUNGS:  Clear. HEART:  Regular rhythm. ABDOMEN:  Soft, gravid, nontender.  No right upper quadrant tenderness noted. PELVIC:  Deferred. GENITOURINARY:  No CVA tenderness. NEUROLOGIC:  Nonfocal. SKIN:  Intact.  IMPRESSION: 1.  A 33-week and 3-day intrauterine pregnancy. 2.  History of indicated preterm birth. 3.  Depression. 4.  Probable chronic hypertension with now superimposed preeclampsia. 5.  Biophysical profile of 6/10.  PLAN:  Admit.  Continuous monitoring and repeat BPP this afternoon with MFM consultation.  Serial labs.  Anticipate attempt at vaginal delivery pending.  Maternal and fetal status, possible inpatient admission until delivery.  LN/NUANCE  D:02/17/2020 T:02/17/2020 JOB:[id number]

## 2020-02-17 NOTE — Progress Notes (Signed)
Dr. Billy Coast notified about patients BP. RN instructed to continue monitoring BP and discontinue continuous fetal monitoring. RN instructed to order TID NST monitoring.

## 2020-02-18 ENCOUNTER — Encounter (HOSPITAL_COMMUNITY): Payer: Self-pay | Admitting: Obstetrics and Gynecology

## 2020-02-18 DIAGNOSIS — O1413 Severe pre-eclampsia, third trimester: Secondary | ICD-10-CM | POA: Diagnosis not present

## 2020-02-18 DIAGNOSIS — Z362 Encounter for other antenatal screening follow-up: Secondary | ICD-10-CM

## 2020-02-18 DIAGNOSIS — O10013 Pre-existing essential hypertension complicating pregnancy, third trimester: Secondary | ICD-10-CM

## 2020-02-18 DIAGNOSIS — O1495 Unspecified pre-eclampsia, complicating the puerperium: Secondary | ICD-10-CM | POA: Diagnosis present

## 2020-02-18 DIAGNOSIS — O113 Pre-existing hypertension with pre-eclampsia, third trimester: Secondary | ICD-10-CM | POA: Diagnosis not present

## 2020-02-18 DIAGNOSIS — Z3A33 33 weeks gestation of pregnancy: Secondary | ICD-10-CM

## 2020-02-18 DIAGNOSIS — O1493 Unspecified pre-eclampsia, third trimester: Secondary | ICD-10-CM | POA: Diagnosis not present

## 2020-02-18 LAB — TYPE AND SCREEN
ABO/RH(D): A POS
Antibody Screen: NEGATIVE

## 2020-02-18 LAB — ABO/RH: ABO/RH(D): A POS

## 2020-02-18 MED ORDER — MAGNESIUM SULFATE BOLUS VIA INFUSION
4.0000 g | Freq: Once | INTRAVENOUS | Status: AC
  Administered 2020-02-18: 4 g via INTRAVENOUS
  Filled 2020-02-18: qty 1000

## 2020-02-18 MED ORDER — MAGNESIUM SULFATE 40 GM/1000ML IV SOLN
2.0000 g/h | INTRAVENOUS | Status: DC
  Administered 2020-02-19: 2 g/h via INTRAVENOUS
  Filled 2020-02-18: qty 1000

## 2020-02-18 MED ORDER — MAGNESIUM SULFATE 40 GM/1000ML IV SOLN
INTRAVENOUS | Status: AC
  Filled 2020-02-18: qty 1000

## 2020-02-18 MED ORDER — BETAMETHASONE SOD PHOS & ACET 6 (3-3) MG/ML IJ SUSP
12.0000 mg | Freq: Once | INTRAMUSCULAR | Status: AC
  Administered 2020-02-18: 12 mg via INTRAMUSCULAR
  Filled 2020-02-18: qty 5

## 2020-02-18 MED ORDER — LACTATED RINGERS IV SOLN: INTRAVENOUS | Status: DC

## 2020-02-18 MED ORDER — ACETAMINOPHEN 325 MG PO TABS
650.0000 mg | ORAL_TABLET | Freq: Four times a day (QID) | ORAL | Status: DC | PRN
  Administered 2020-02-18 – 2020-02-19 (×3): 650 mg via ORAL
  Filled 2020-02-18 (×3): qty 2

## 2020-02-18 MED ORDER — LABETALOL HCL 200 MG PO TABS
400.0000 mg | ORAL_TABLET | Freq: Three times a day (TID) | ORAL | Status: DC
  Administered 2020-02-18 – 2020-02-19 (×4): 400 mg via ORAL
  Filled 2020-02-18 (×4): qty 2

## 2020-02-18 MED ORDER — FAMOTIDINE 20 MG PO TABS
20.0000 mg | ORAL_TABLET | Freq: Every day | ORAL | Status: DC
  Administered 2020-02-18 – 2020-02-19 (×2): 20 mg via ORAL
  Filled 2020-02-18 (×2): qty 1

## 2020-02-18 NOTE — Progress Notes (Signed)
No complaints on Mag Good FM. No HAs or visual changes or epigastric pain. BP (!) 154/101 (BP Location: Right Arm)   Pulse 86   Temp 97.9 F (36.6 C) (Oral)   Resp 18   Ht 5\' 6"  (1.676 m)   Wt 82.1 kg   SpO2 99%   BMI 29.21 kg/m   No severe range BPs since Mag started.  UO 900 cc since 0700 Categpry 1 tracing Continue Magnesium x 24h Rpt labs in am

## 2020-02-18 NOTE — Consult Note (Signed)
Asked by Dr.Taavon to provide prenatal consultation for 35 y.o. G4 P1-2-0-3 patient who is now 33.[redacted] weeks EGA, with pregnancy complicated by pre-eclampsia.  She is being treated with rescue betamethasone (first doses late March) and MgSO4.  Met with patient and FOB. Their previous child was born at 53 wks (at [facility]) and did well, discharged about 2 wks of age. I reviewed usual expectations for preterm infant at 53 - [redacted] weeks gestation, including possible needs for respiratory support, IV access, and feeding problems.  I mentioned that their previous child's NICU course of only 2 weeks was unusual, and emphasized that discharge would most likely depend on feeding ability.   Mother had good success with providing pumped breast milk with previous child and plans to do the same this time. I told them donor milk would be available as bridge pending establishment of her own milk supply.  I also told them of single family rooms in NICU and their ability to "visit" 24/7, although mother will not be a candidate for couplet care due to Mg infusion.  Patient and FOB were attentive, had no questions, and were appreciative of my input.  Thank you for consulting Neonatology.  Total time 25 minutes (15 min face-to-face)  JWimmer, MD

## 2020-02-18 NOTE — Progress Notes (Signed)
HD#! 33w 4d PEC w severe range BP  S: No complaints. Good FM, Denies HAs , visual changes or epigastric pain Denies CP or SOB  BP (!) 155/104 (BP Location: Right Arm)   Pulse 79   Temp 98.7 F (37.1 C) (Oral)   Resp 18   Ht 5\' 6"  (1.676 m)   Wt 82.1 kg   SpO2 96%   BMI 29.21 kg/m   HEENT: nl Neck : supple Lungs: cTA CV: RRR ABD: gravid, NT, No RUQ tendernes No CVAT EXT: DTRs 3+, no cords. Neuro: non focal Skin :intact  CBC    Component Value Date/Time   WBC 6.1 02/17/2020 1447   RBC 3.92 02/17/2020 1447   HGB 12.1 02/17/2020 1447   HCT 36.7 02/17/2020 1447   PLT 147 (L) 02/17/2020 1447   MCV 93.6 02/17/2020 1447   MCH 30.9 02/17/2020 1447   MCHC 33.0 02/17/2020 1447   RDW 12.7 02/17/2020 1447   LYMPHSABS 1.4 02/17/2020 1447   MONOABS 0.4 02/17/2020 1447   EOSABS 0.0 02/17/2020 1447   BASOSABS 0.0 02/17/2020 1447    CMP     Component Value Date/Time   NA 135 02/17/2020 1617   K 3.8 02/17/2020 1617   CL 106 02/17/2020 1617   CO2 19 (L) 02/17/2020 1617   GLUCOSE 114 (H) 02/17/2020 1617   BUN 16 02/17/2020 1617   CREATININE 0.65 02/17/2020 1617   CREATININE 0.74 05/08/2016 1907   CALCIUM 8.5 (L) 02/17/2020 1617   PROT 5.1 (L) 02/17/2020 1617   ALBUMIN 2.3 (L) 02/17/2020 1617   AST 18 02/17/2020 1617   ALT 18 02/17/2020 1617   ALKPHOS 96 02/17/2020 1617   BILITOT 0.5 02/17/2020 1617   GFRNONAA >60 02/17/2020 1617   GFRNONAA >89 05/08/2016 1907   GFRAA >60 02/17/2020 1617   GFRAA >89 05/08/2016 1907    FHR 130s -140s , BTBV >5, Category 1 tracing. Rare contractions UO 300 overnight  IMP: 33w 4d IUP PEC w/ severe range BP. Labile. Labs stable. Fetal status reassuring. BP meds incorrectly dosed on admission.  P: MagSO4 x 24hrs Labetalol 400mg  tid, Procardia 30xl bid NST TID Watch BP closely Deliver by 34 wks. BMZ #2 (rescue) today.

## 2020-02-18 NOTE — Progress Notes (Signed)
Mild headache on Mag Good FM. CP, SOB.visual changes or epigastric pain. BP (!) 143/90 (BP Location: Right Arm)   Pulse 92   Temp 97.7 F (36.5 C) (Oral)   Resp 20   Ht 5\' 6"  (1.676 m)   Wt 82.1 kg   SpO2 99%   BMI 29.21 kg/m   No severe range BPs since Mag started.  UO 1450 cc since 0700 Categpry 1 tracing, BTBV 5-15, occ 10x10 accel , no decels Continue Magnesium x 24h Rpt labs in am

## 2020-02-18 NOTE — Progress Notes (Signed)
Mild headache on Mag improved after Tylenol Good FM. CP, SOB.visual changes or epigastric pain. BP (!) 143/94 (BP Location: Left Arm)   Pulse 93   Temp 97.7 F (36.5 C) (Oral)   Resp 19   Ht 5\' 6"  (1.676 m)   Wt 82.1 kg   SpO2 99%   BMI 29.21 kg/m   No severe range BPs since Mag started.  UO 2375 cc since 0700 Categpry 1 tracing, BTBV 5-15, occ 10x10 accel , no decels Continue Magnesium x 24h Rpt labs in am

## 2020-02-19 ENCOUNTER — Encounter (HOSPITAL_COMMUNITY)
Admission: AD | Disposition: A | Discharge status: Home / Self Care | Payer: Self-pay | Source: Home / Self Care | Attending: Obstetrics and Gynecology

## 2020-02-19 ENCOUNTER — Encounter (HOSPITAL_COMMUNITY): Payer: Self-pay | Admitting: Obstetrics and Gynecology

## 2020-02-19 ENCOUNTER — Inpatient Hospital Stay (HOSPITAL_COMMUNITY): Payer: BC Managed Care – PPO | Admitting: Anesthesiology

## 2020-02-19 DIAGNOSIS — O1414 Severe pre-eclampsia complicating childbirth: Secondary | ICD-10-CM | POA: Diagnosis not present

## 2020-02-19 DIAGNOSIS — O365930 Maternal care for other known or suspected poor fetal growth, third trimester, not applicable or unspecified: Secondary | ICD-10-CM | POA: Diagnosis not present

## 2020-02-19 DIAGNOSIS — O114 Pre-existing hypertension with pre-eclampsia, complicating childbirth: Secondary | ICD-10-CM | POA: Diagnosis not present

## 2020-02-19 DIAGNOSIS — Z3A33 33 weeks gestation of pregnancy: Secondary | ICD-10-CM | POA: Diagnosis not present

## 2020-02-19 LAB — COMPREHENSIVE METABOLIC PANEL
ALT: 15 U/L (ref 0–44)
AST: 18 U/L (ref 15–41)
Albumin: 2.3 g/dL — ABNORMAL LOW (ref 3.5–5.0)
Alkaline Phosphatase: 95 U/L (ref 38–126)
Anion gap: 10 (ref 5–15)
BUN: 19 mg/dL (ref 6–20)
CO2: 18 mmol/L — ABNORMAL LOW (ref 22–32)
Calcium: 7.2 mg/dL — ABNORMAL LOW (ref 8.9–10.3)
Chloride: 107 mmol/L (ref 98–111)
Creatinine, Ser: 0.74 mg/dL (ref 0.44–1.00)
GFR calc Af Amer: >60 mL/min (ref >60)
GFR calc non Af Amer: >60 mL/min (ref >60)
Glucose, Bld: 141 mg/dL — ABNORMAL HIGH (ref 70–99)
Potassium: 3.9 mmol/L (ref 3.5–5.1)
Sodium: 135 mmol/L (ref 135–145)
Total Bilirubin: 0.4 mg/dL (ref 0.3–1.2)
Total Protein: 4.9 g/dL — ABNORMAL LOW (ref 6.5–8.1)

## 2020-02-19 LAB — CBC
HCT: 34.6 % — ABNORMAL LOW (ref 36.0–46.0)
Hemoglobin: 11.6 g/dL — ABNORMAL LOW (ref 12.0–15.0)
MCH: 32 pg (ref 26.0–34.0)
MCHC: 33.5 g/dL (ref 30.0–36.0)
MCV: 95.3 fL (ref 80.0–100.0)
Platelets: 149 10*3/uL — ABNORMAL LOW (ref 150–400)
RBC: 3.63 MIL/uL — ABNORMAL LOW (ref 3.87–5.11)
RDW: 12.6 % (ref 11.5–15.5)
WBC: 7 10*3/uL (ref 4.0–10.5)
nRBC: 0 % (ref 0.0–0.2)

## 2020-02-19 LAB — GROUP B STREP BY PCR: Group B strep by PCR: POSITIVE — AB

## 2020-02-19 LAB — RPR: RPR Ser Ql: NONREACTIVE

## 2020-02-19 SURGERY — Surgical Case
Anesthesia: Spinal

## 2020-02-19 MED ORDER — STERILE WATER FOR IRRIGATION IR SOLN
Status: DC | PRN
  Administered 2020-02-19: 1000 mL

## 2020-02-19 MED ORDER — KETOROLAC TROMETHAMINE 30 MG/ML IJ SOLN: 30.0000 mg | Freq: Once | INTRAMUSCULAR | Status: DC

## 2020-02-19 MED ORDER — TRANEXAMIC ACID-NACL 1000-0.7 MG/100ML-% IV SOLN
INTRAVENOUS | Status: AC
  Filled 2020-02-19: qty 100

## 2020-02-19 MED ORDER — PHENYLEPHRINE HCL-NACL 20-0.9 MG/250ML-% IV SOLN
INTRAVENOUS | Status: DC | PRN
  Administered 2020-02-19: 60 ug/min via INTRAVENOUS

## 2020-02-19 MED ORDER — BUPIVACAINE HCL (PF) 0.25 % IJ SOLN
INTRAMUSCULAR | Status: AC
  Filled 2020-02-19: qty 30

## 2020-02-19 MED ORDER — PROMETHAZINE HCL 25 MG/ML IJ SOLN: 6.2500 mg | INTRAMUSCULAR | Status: DC | PRN

## 2020-02-19 MED ORDER — FENTANYL CITRATE (PF) 100 MCG/2ML IJ SOLN
INTRAMUSCULAR | Status: AC
  Filled 2020-02-19: qty 2

## 2020-02-19 MED ORDER — ONDANSETRON HCL 4 MG/2ML IJ SOLN
INTRAMUSCULAR | Status: AC
  Filled 2020-02-19: qty 2

## 2020-02-19 MED ORDER — SODIUM CHLORIDE 0.9 % IR SOLN
Status: DC | PRN
  Administered 2020-02-19: 1

## 2020-02-19 MED ORDER — DIPHENHYDRAMINE HCL 25 MG PO CAPS
25.0000 mg | ORAL_CAPSULE | ORAL | Status: DC | PRN
  Filled 2020-02-19: qty 1

## 2020-02-19 MED ORDER — DIPHENHYDRAMINE HCL 25 MG PO CAPS
25.0000 mg | ORAL_CAPSULE | Freq: Four times a day (QID) | ORAL | Status: DC | PRN
  Administered 2020-02-21: 25 mg via ORAL
  Filled 2020-02-19: qty 1

## 2020-02-19 MED ORDER — BUPROPION HCL ER (SR) 100 MG PO TB12: 200.0000 mg | ORAL_TABLET | Freq: Two times a day (BID) | ORAL | Status: DC

## 2020-02-19 MED ORDER — MORPHINE SULFATE (PF) 0.5 MG/ML IJ SOLN
INTRAMUSCULAR | Status: AC
  Filled 2020-02-19: qty 10

## 2020-02-19 MED ORDER — SIMETHICONE 80 MG PO CHEW
80.0000 mg | CHEWABLE_TABLET | Freq: Three times a day (TID) | ORAL | Status: DC
  Administered 2020-02-20 – 2020-02-24 (×13): 80 mg via ORAL
  Filled 2020-02-19 (×13): qty 1

## 2020-02-19 MED ORDER — TRANEXAMIC ACID 1000 MG/10ML IV SOLN
INTRAVENOUS | Status: DC | PRN
  Administered 2020-02-19: 1000 mg via INTRAVENOUS

## 2020-02-19 MED ORDER — MAGNESIUM SULFATE BOLUS VIA INFUSION
4.0000 g | Freq: Once | INTRAVENOUS | Status: AC
  Administered 2020-02-19: 4 g via INTRAVENOUS
  Filled 2020-02-19: qty 1000

## 2020-02-19 MED ORDER — SENNOSIDES-DOCUSATE SODIUM 8.6-50 MG PO TABS
2.0000 | ORAL_TABLET | ORAL | Status: DC
  Administered 2020-02-20 – 2020-02-23 (×5): 2 via ORAL
  Filled 2020-02-19 (×5): qty 2

## 2020-02-19 MED ORDER — ACETAMINOPHEN 500 MG PO TABS
1000.0000 mg | ORAL_TABLET | Freq: Four times a day (QID) | ORAL | Status: AC
  Administered 2020-02-20 (×2): 1000 mg via ORAL
  Filled 2020-02-19 (×2): qty 2

## 2020-02-19 MED ORDER — PRENATAL MULTIVITAMIN CH
1.0000 | ORAL_TABLET | Freq: Every day | ORAL | Status: DC
  Administered 2020-02-20 – 2020-02-24 (×5): 1 via ORAL
  Filled 2020-02-19 (×5): qty 1

## 2020-02-19 MED ORDER — FENTANYL CITRATE (PF) 100 MCG/2ML IJ SOLN: 25.0000 ug | INTRAMUSCULAR | Status: DC | PRN

## 2020-02-19 MED ORDER — OXYCODONE-ACETAMINOPHEN 5-325 MG PO TABS
1.0000 | ORAL_TABLET | ORAL | Status: DC | PRN
  Administered 2020-02-19: 21:00:00 1 via ORAL
  Administered 2020-02-20: 2 via ORAL
  Administered 2020-02-20: 1 via ORAL
  Administered 2020-02-20 – 2020-02-21 (×2): 2 via ORAL
  Administered 2020-02-21: 1 via ORAL
  Administered 2020-02-21 (×3): 2 via ORAL
  Administered 2020-02-22 – 2020-02-24 (×5): 1 via ORAL
  Filled 2020-02-19: qty 2
  Filled 2020-02-19 (×3): qty 1
  Filled 2020-02-19: qty 2
  Filled 2020-02-19 (×2): qty 1
  Filled 2020-02-19 (×3): qty 2
  Filled 2020-02-19 (×2): qty 1
  Filled 2020-02-19: qty 2
  Filled 2020-02-19: qty 1

## 2020-02-19 MED ORDER — OXYTOCIN 40 UNITS IN NORMAL SALINE INFUSION - SIMPLE MED
INTRAVENOUS | Status: AC
  Filled 2020-02-19: qty 1000

## 2020-02-19 MED ORDER — ACETAMINOPHEN 160 MG/5ML PO SOLN: 1000.0000 mg | Freq: Once | ORAL | Status: DC

## 2020-02-19 MED ORDER — IBUPROFEN 800 MG PO TABS
800.0000 mg | ORAL_TABLET | Freq: Four times a day (QID) | ORAL | Status: DC
  Administered 2020-02-20 – 2020-02-24 (×15): 800 mg via ORAL
  Filled 2020-02-19 (×15): qty 1

## 2020-02-19 MED ORDER — BUPIVACAINE HCL (PF) 0.25 % IJ SOLN
INTRAMUSCULAR | Status: DC | PRN
  Administered 2020-02-19: 30 mL

## 2020-02-19 MED ORDER — SODIUM CHLORIDE 0.9 % IV SOLN: INTRAVENOUS | Status: DC | PRN

## 2020-02-19 MED ORDER — CEFAZOLIN SODIUM-DEXTROSE 2-4 GM/100ML-% IV SOLN
2.0000 g | INTRAVENOUS | Status: AC
  Administered 2020-02-19: 2 g via INTRAVENOUS
  Filled 2020-02-19: qty 100

## 2020-02-19 MED ORDER — LABETALOL HCL 200 MG PO TABS
200.0000 mg | ORAL_TABLET | Freq: Two times a day (BID) | ORAL | Status: DC
  Administered 2020-02-19 – 2020-02-21 (×4): 200 mg via ORAL
  Filled 2020-02-19 (×4): qty 1

## 2020-02-19 MED ORDER — LACTATED RINGERS IV SOLN: INTRAVENOUS | Status: DC

## 2020-02-19 MED ORDER — KETOROLAC TROMETHAMINE 30 MG/ML IJ SOLN: 30.0000 mg | Freq: Four times a day (QID) | INTRAMUSCULAR | Status: AC | PRN

## 2020-02-19 MED ORDER — NALOXONE HCL 0.4 MG/ML IJ SOLN: 0.4000 mg | INTRAMUSCULAR | Status: DC | PRN

## 2020-02-19 MED ORDER — LACTATED RINGERS IV SOLN: INTRAVENOUS | Status: DC | PRN

## 2020-02-19 MED ORDER — MAGNESIUM SULFATE 40 GM/1000ML IV SOLN
2.0000 g/h | INTRAVENOUS | Status: AC
  Administered 2020-02-19 – 2020-02-20 (×2): 2 g/h via INTRAVENOUS
  Filled 2020-02-19: qty 1000

## 2020-02-19 MED ORDER — NALBUPHINE HCL 10 MG/ML IJ SOLN: 5.0000 mg | INTRAMUSCULAR | Status: DC | PRN

## 2020-02-19 MED ORDER — MAGNESIUM SULFATE 40 GM/1000ML IV SOLN
INTRAVENOUS | Status: AC
  Filled 2020-02-19: qty 1000

## 2020-02-19 MED ORDER — NALBUPHINE HCL 10 MG/ML IJ SOLN
5.0000 mg | INTRAMUSCULAR | Status: DC | PRN
  Administered 2020-02-20 (×4): 5 mg via INTRAVENOUS
  Filled 2020-02-19 (×5): qty 1

## 2020-02-19 MED ORDER — DIBUCAINE (PERIANAL) 1 % EX OINT: 1.0000 "application " | TOPICAL_OINTMENT | CUTANEOUS | Status: DC | PRN

## 2020-02-19 MED ORDER — TETANUS-DIPHTH-ACELL PERTUSSIS 5-2.5-18.5 LF-MCG/0.5 IM SUSP
0.5000 mL | Freq: Once | INTRAMUSCULAR | Status: AC
  Administered 2020-02-21: 09:00:00 0.5 mL via INTRAMUSCULAR
  Filled 2020-02-19: qty 0.5

## 2020-02-19 MED ORDER — OXYTOCIN 40 UNITS IN NORMAL SALINE INFUSION - SIMPLE MED
INTRAVENOUS | Status: DC | PRN
  Administered 2020-02-19: 40 [IU] via INTRAVENOUS

## 2020-02-19 MED ORDER — WITCH HAZEL-GLYCERIN EX PADS: 1.0000 "application " | MEDICATED_PAD | CUTANEOUS | Status: DC | PRN

## 2020-02-19 MED ORDER — NALOXONE HCL 4 MG/10ML IJ SOLN
1.0000 ug/kg/h | INTRAVENOUS | Status: DC | PRN
  Filled 2020-02-19: qty 5

## 2020-02-19 MED ORDER — BUPROPION HCL ER (XL) 150 MG PO TB24
150.0000 mg | ORAL_TABLET | Freq: Every day | ORAL | Status: DC
  Administered 2020-02-19 – 2020-02-23 (×5): 150 mg via ORAL
  Filled 2020-02-19 (×6): qty 1

## 2020-02-19 MED ORDER — ESCITALOPRAM OXALATE 10 MG PO TABS
20.0000 mg | ORAL_TABLET | Freq: Every day | ORAL | Status: DC
  Administered 2020-02-20 – 2020-02-24 (×5): 20 mg via ORAL
  Filled 2020-02-19 (×6): qty 2

## 2020-02-19 MED ORDER — NALBUPHINE HCL 10 MG/ML IJ SOLN: 5.0000 mg | Freq: Once | INTRAMUSCULAR | Status: AC | PRN

## 2020-02-19 MED ORDER — MENTHOL 3 MG MT LOZG: 1.0000 | LOZENGE | OROMUCOSAL | Status: DC | PRN

## 2020-02-19 MED ORDER — SOD CITRATE-CITRIC ACID 500-334 MG/5ML PO SOLN
30.0000 mL | ORAL | Status: AC
  Administered 2020-02-19: 30 mL via ORAL
  Filled 2020-02-19: qty 30

## 2020-02-19 MED ORDER — MORPHINE SULFATE (PF) 0.5 MG/ML IJ SOLN
INTRAMUSCULAR | Status: DC | PRN
  Administered 2020-02-19: .15 mg via INTRATHECAL

## 2020-02-19 MED ORDER — DIPHENHYDRAMINE HCL 50 MG/ML IJ SOLN
12.5000 mg | INTRAMUSCULAR | Status: DC | PRN
  Administered 2020-02-19: 12.5 mg via INTRAVENOUS
  Filled 2020-02-19: qty 1

## 2020-02-19 MED ORDER — KETOROLAC TROMETHAMINE 30 MG/ML IJ SOLN
30.0000 mg | Freq: Four times a day (QID) | INTRAMUSCULAR | Status: AC
  Administered 2020-02-19 – 2020-02-20 (×4): 30 mg via INTRAVENOUS
  Filled 2020-02-19 (×4): qty 1

## 2020-02-19 MED ORDER — SIMETHICONE 80 MG PO CHEW: 80.0000 mg | CHEWABLE_TABLET | ORAL | Status: DC | PRN

## 2020-02-19 MED ORDER — NALBUPHINE HCL 10 MG/ML IJ SOLN
5.0000 mg | Freq: Once | INTRAMUSCULAR | Status: AC | PRN
  Administered 2020-02-19: 5 mg via INTRAVENOUS

## 2020-02-19 MED ORDER — ONDANSETRON HCL 4 MG/2ML IJ SOLN
INTRAMUSCULAR | Status: DC | PRN
  Administered 2020-02-19: 4 mg via INTRAVENOUS

## 2020-02-19 MED ORDER — ZOLPIDEM TARTRATE 5 MG PO TABS
5.0000 mg | ORAL_TABLET | Freq: Every evening | ORAL | Status: DC | PRN
  Administered 2020-02-22 – 2020-02-23 (×2): 5 mg via ORAL
  Filled 2020-02-19 (×2): qty 1

## 2020-02-19 MED ORDER — ONDANSETRON HCL 4 MG/2ML IJ SOLN: 4.0000 mg | Freq: Three times a day (TID) | INTRAMUSCULAR | Status: DC | PRN

## 2020-02-19 MED ORDER — OXYTOCIN 40 UNITS IN NORMAL SALINE INFUSION - SIMPLE MED
2.5000 [IU]/h | INTRAVENOUS | Status: AC
  Administered 2020-02-19: 2.5 [IU]/h via INTRAVENOUS

## 2020-02-19 MED ORDER — COCONUT OIL OIL
1.0000 "application " | TOPICAL_OIL | Status: DC | PRN
  Administered 2020-02-24: 1 via TOPICAL

## 2020-02-19 MED ORDER — SIMETHICONE 80 MG PO CHEW
80.0000 mg | CHEWABLE_TABLET | ORAL | Status: DC
  Administered 2020-02-20 – 2020-02-23 (×5): 80 mg via ORAL
  Filled 2020-02-19 (×5): qty 1

## 2020-02-19 MED ORDER — ACETAMINOPHEN 500 MG PO TABS: 1000.0000 mg | ORAL_TABLET | Freq: Once | ORAL | Status: DC

## 2020-02-19 MED ORDER — SODIUM CHLORIDE 0.9% FLUSH: 3.0000 mL | INTRAVENOUS | Status: DC | PRN

## 2020-02-19 MED ORDER — BUPIVACAINE IN DEXTROSE 0.75-8.25 % IT SOLN
INTRATHECAL | Status: DC | PRN
  Administered 2020-02-19: 1.6 mL via INTRATHECAL

## 2020-02-19 MED ORDER — FENTANYL CITRATE (PF) 100 MCG/2ML IJ SOLN
INTRAMUSCULAR | Status: DC | PRN
  Administered 2020-02-19: 15 ug via INTRATHECAL

## 2020-02-19 MED ORDER — PHENYLEPHRINE HCL-NACL 20-0.9 MG/250ML-% IV SOLN
INTRAVENOUS | Status: AC
  Filled 2020-02-19: qty 250

## 2020-02-19 SURGICAL SUPPLY — 34 items
BENZOIN TINCTURE PRP APPL 2/3 (GAUZE/BANDAGES/DRESSINGS) ×2 IMPLANT
CHLORAPREP W/TINT 26ML (MISCELLANEOUS) ×2 IMPLANT
CLAMP CORD UMBIL (MISCELLANEOUS) IMPLANT
CLOTH BEACON ORANGE TIMEOUT ST (SAFETY) ×2 IMPLANT
CLSR STERI-STRIP ANTIMIC 1/2X4 (GAUZE/BANDAGES/DRESSINGS) ×2 IMPLANT
DRSG OPSITE POSTOP 4X10 (GAUZE/BANDAGES/DRESSINGS) ×2 IMPLANT
ELECT REM PT RETURN 9FT ADLT (ELECTROSURGICAL) ×2
ELECTRODE REM PT RTRN 9FT ADLT (ELECTROSURGICAL) ×1 IMPLANT
EXTRACTOR VACUUM M CUP 4 TUBE (SUCTIONS) IMPLANT
GLOVE BIO SURGEON STRL SZ7.5 (GLOVE) ×2 IMPLANT
GLOVE BIOGEL PI IND STRL 7.0 (GLOVE) ×1 IMPLANT
GLOVE BIOGEL PI INDICATOR 7.0 (GLOVE) ×1
GOWN STRL REUS W/TWL LRG LVL3 (GOWN DISPOSABLE) ×4 IMPLANT
KIT ABG SYR 3ML LUER SLIP (SYRINGE) IMPLANT
NEEDLE HYPO 22GX1.5 SAFETY (NEEDLE) ×2 IMPLANT
NEEDLE HYPO 25X5/8 SAFETYGLIDE (NEEDLE) IMPLANT
NEEDLE SPNL 20GX3.5 QUINCKE YW (NEEDLE) IMPLANT
NS IRRIG 1000ML POUR BTL (IV SOLUTION) ×2 IMPLANT
PACK C SECTION WH (CUSTOM PROCEDURE TRAY) ×2 IMPLANT
PENCIL SMOKE EVAC W/HOLSTER (ELECTROSURGICAL) ×2 IMPLANT
SUT MNCRL 0 VIOLET CTX 36 (SUTURE) ×2 IMPLANT
SUT MNCRL AB 3-0 PS2 27 (SUTURE) IMPLANT
SUT MON AB 2-0 CT1 27 (SUTURE) ×2 IMPLANT
SUT MON AB-0 CT1 36 (SUTURE) ×4 IMPLANT
SUT MONOCRYL 0 CTX 36 (SUTURE) ×2
SUT PLAIN 0 NONE (SUTURE) IMPLANT
SUT PLAIN 2 0 (SUTURE) ×1
SUT PLAIN 2 0 XLH (SUTURE) IMPLANT
SUT PLAIN ABS 2-0 CT1 27XMFL (SUTURE) ×1 IMPLANT
SYR 20CC LL (SYRINGE) IMPLANT
SYR CONTROL 10ML LL (SYRINGE) ×2 IMPLANT
TOWEL OR 17X24 6PK STRL BLUE (TOWEL DISPOSABLE) ×2 IMPLANT
TRAY FOLEY W/BAG SLVR 14FR LF (SET/KITS/TRAYS/PACK) ×2 IMPLANT
WATER STERILE IRR 1000ML POUR (IV SOLUTION) ×2 IMPLANT

## 2020-02-19 NOTE — Anesthesia Preprocedure Evaluation (Addendum)
Anesthesia Evaluation  Patient identified by MRN, date of birth, ID band Patient awake    Reviewed: Allergy & Precautions, NPO status , Patient's Chart, lab work & pertinent test results, reviewed documented beta blocker date and time   History of Anesthesia Complications Negative for: history of anesthetic complications  Airway Mallampati: I  TM Distance: >3 FB Neck ROM: Full    Dental no notable dental hx.    Pulmonary neg pulmonary ROS,    Pulmonary exam normal        Cardiovascular hypertension, Pt. on home beta blockers and Pt. on medications Normal cardiovascular exam     Neuro/Psych Anxiety Depression negative neurological ROS     GI/Hepatic negative GI ROS, Neg liver ROS,   Endo/Other  negative endocrine ROS  Renal/GU negative Renal ROS  negative genitourinary   Musculoskeletal negative musculoskeletal ROS (+)   Abdominal   Peds  Hematology Hgb 11.6, plt 149   Anesthesia Other Findings Day of surgery medications reviewed with patient.  Reproductive/Obstetrics (+) Pregnancy [redacted]w[redacted]d, cHTN with superimposed preE                              Anesthesia Physical Anesthesia Plan  ASA: III  Anesthesia Plan: Spinal   Post-op Pain Management:    Induction:   PONV Risk Score and Plan: 3 and Treatment may vary due to age or medical condition, Ondansetron and Dexamethasone  Airway Management Planned: Natural Airway  Additional Equipment:   Intra-op Plan:   Post-operative Plan:   Informed Consent: I have reviewed the patients History and Physical, chart, labs and discussed the procedure including the risks, benefits and alternatives for the proposed anesthesia with the patient or authorized representative who has indicated his/her understanding and acceptance.       Plan Discussed with: CRNA  Anesthesia Plan Comments:       Anesthesia Quick Evaluation

## 2020-02-19 NOTE — Progress Notes (Signed)
HD#2 33w 5d PEC w severe range BP  S: No complaints. Good FM, Denies HAs , visual changes or epigastric pain Denies CP or SOB  BP 129/89 (BP Location: Right Arm)   Pulse 79   Temp 97.7 F (36.5 C) (Oral)   Resp 18   Ht 5\' 6"  (1.676 m)   Wt 82.1 kg   SpO2 100%   BMI 29.21 kg/m   HEENT: nl Neck : supple Lungs: cTA CV: RRR ABD: gravid, NT, No RUQ tendernes No CVAT VE: 2/thick/-3 EXT: DTRs 3+, no cords. Neuro: non focal Skin :intact  CBC    Component Value Date/Time   WBC 6.1 02/17/2020 1447   RBC 3.92 02/17/2020 1447   HGB 12.1 02/17/2020 1447   HCT 36.7 02/17/2020 1447   PLT 147 (L) 02/17/2020 1447   MCV 93.6 02/17/2020 1447   MCH 30.9 02/17/2020 1447   MCHC 33.0 02/17/2020 1447   RDW 12.7 02/17/2020 1447   LYMPHSABS 1.4 02/17/2020 1447   MONOABS 0.4 02/17/2020 1447   EOSABS 0.0 02/17/2020 1447   BASOSABS 0.0 02/17/2020 1447    CMP     Component Value Date/Time   NA 135 02/17/2020 1617   K 3.8 02/17/2020 1617   CL 106 02/17/2020 1617   CO2 19 (L) 02/17/2020 1617   GLUCOSE 114 (H) 02/17/2020 1617   BUN 16 02/17/2020 1617   CREATININE 0.65 02/17/2020 1617   CREATININE 0.74 05/08/2016 1907   CALCIUM 8.5 (L) 02/17/2020 1617   PROT 5.1 (L) 02/17/2020 1617   ALBUMIN 2.3 (L) 02/17/2020 1617   AST 18 02/17/2020 1617   ALT 18 02/17/2020 1617   ALKPHOS 96 02/17/2020 1617   BILITOT 0.5 02/17/2020 1617   GFRNONAA >60 02/17/2020 1617   GFRNONAA >89 05/08/2016 1907   GFRAA >60 02/17/2020 1617   GFRAA >89 05/08/2016 1907    FHR 120s , BTBV >5, Category 1 tracing. Rare contractions. Rare late decel noted UO 2850cc/24h  IMP: 33w 5d IUP PEC w/ severe range BP. Labile. Labs stable. Fetal status reassuring. BPs improved on Mag  P: MagSO4 x 24hrs-dc this am Rpt Labs at noon Labetalol 400mg  tid, Procardia 30xl bid NST TID Watch BP closely BMZ #2 (rescue) done.  Discussed plan of care with patient and husband. Due to fetal status, labile BPs will consider  delivery options. DC Mag at 0900 . Pending labs , BP status and fetal status will discuss route of delivery. Planned primary csection now at 1730, 48hrs post BMZ 1. OR aware. If fetal status reassuring, BPs stable and labs stable, will consider IOL. Csection risks / benefits discussed. Consent done.

## 2020-02-19 NOTE — Transfer of Care (Signed)
Immediate Anesthesia Transfer of Care Note  Patient: Allison Riggs  Procedure(s) Performed: CESAREAN SECTION (N/A )  Patient Location: PACU  Anesthesia Type:Spinal  Level of Consciousness: awake, alert  and oriented  Airway & Oxygen Therapy: Patient Spontanous Breathing  Post-op Assessment: Report given to RN and Post -op Vital signs reviewed and stable  Post vital signs: Reviewed and stable  Last Vitals:  Vitals Value Taken Time  BP    Temp    Pulse 73 02/19/20 1836  Resp 12 02/19/20 1836  SpO2 99 % 02/19/20 1836  Vitals shown include unvalidated device data.  Last Pain:  Vitals:   02/19/20 1706  TempSrc: Oral  PainSc:       Patients Stated Pain Goal: 2 (02/19/20 0745)  Complications: No apparent anesthesia complications

## 2020-02-19 NOTE — Op Note (Signed)
Cesarean Section Procedure Note  Indications: non-reassuring fetal status and severe preeclampsia  Pre-operative Diagnosis: 33 week 5 day pregnancy.  Post-operative Diagnosis: same  Surgeon: Lenoard Aden   Assistants: na  Anesthesia: Local anesthesia 0.25.% bupivacaine and Spinal anesthesia  ASA Class: 2  Procedure Details  The patient was seen in the Holding Room. The risks, benefits, complications, treatment options, and expected outcomes were discussed with the patient.  The patient concurred with the proposed plan, giving informed consent. The risks of anesthesia, infection, bleeding and possible injury to other organs discussed. Injury to bowel, bladder, or ureter with possible need for repair discussed. Possible need for transfusion with secondary risks of hepatitis or HIV acquisition discussed. Post operative complications to include but not limited to DVT, PE and Pneumonia noted. The site of surgery properly noted/marked. The patient was taken to Operating Room # C, identified as Allison Riggs and the procedure verified as C-Section Delivery. A Time Out was held and the above information confirmed.  After induction of anesthesia, the patient was draped and prepped in the usual sterile manner. A Pfannenstiel incision was made and carried down through the subcutaneous tissue to the fascia. Fascial incision was made and extended transversely using Mayo scissors. The fascia was separated from the underlying rectus tissue superiorly and inferiorly. The peritoneum was identified and entered. Peritoneal incision was extended longitudinally. The utero-vesical peritoneal reflection was incised transversely and the bladder flap was bluntly freed from the lower uterine segment. A low transverse uterine incision(Kerr hysterotomy) was made. Delivered from OA presentation was a  female with Apgar scores of pending at one minute and pending at five minutes. Bulb suctioning gently performed. Neonatal  team in attendance.After the umbilical cord was clamped and cut cord blood was obtained for evaluation. The placenta was removed intact and appeared normal. The uterus was curetted with a dry lap pack. Good hemostasis was noted.The uterine outline, tubes and ovaries appeared normal. The uterine incision was closed with running locked sutures of 0 Monocryl x 2 layers. Hemostasis was observed. Lavage was carried out until clear.The parietal peritoneum was closed with a running 2-0 Monocryl suture. The fascia was then reapproximated with running sutures of 0 Monocryl. The skin was reapproximated with 3-0 monocryl after  closure with 2-0 plain.  Instrument, sponge, and needle counts were correct prior the abdominal closure and at the conclusion of the case.   Findings: Premature female, anterior placenta Baby to NICU  Estimated Blood Loss:  500         Drains: foley                 Specimens: na                 Complications:  None; patient tolerated the procedure well.         Disposition: PACU - hemodynamically stable.         Condition: stable  Attending Attestation: I performed the procedure.

## 2020-02-19 NOTE — Progress Notes (Addendum)
No complaints Good FM. CP, SOB.visual changes or epigastric pain.  BP (!) 154/96 (BP Location: Right Arm)   Pulse 79   Temp 97.8 F (36.6 C) (Oral)   Resp 17   Ht 5\' 6"  (1.676 m)   Wt 82.1 kg   SpO2 100%   BMI 29.21 kg/m    Categpry 1-2 tracing, BTBV 5-15, occ 10x10 accel , occ late , occ mild variable decels  CBC    Component Value Date/Time   WBC 7.0 02/19/2020 1246   RBC 3.63 (L) 02/19/2020 1246   HGB 11.6 (L) 02/19/2020 1246   HCT 34.6 (L) 02/19/2020 1246   PLT 149 (L) 02/19/2020 1246   MCV 95.3 02/19/2020 1246   MCH 32.0 02/19/2020 1246   MCHC 33.5 02/19/2020 1246   RDW 12.6 02/19/2020 1246   LYMPHSABS 1.4 02/17/2020 1447   MONOABS 0.4 02/17/2020 1447   EOSABS 0.0 02/17/2020 1447   BASOSABS 0.0 02/17/2020 1447    CMP     Component Value Date/Time   NA 135 02/19/2020 1246   K 3.9 02/19/2020 1246   CL 107 02/19/2020 1246   CO2 18 (L) 02/19/2020 1246   GLUCOSE 141 (H) 02/19/2020 1246   BUN 19 02/19/2020 1246   CREATININE 0.74 02/19/2020 1246   CREATININE 0.74 05/08/2016 1907   CALCIUM 7.2 (L) 02/19/2020 1246   PROT 4.9 (L) 02/19/2020 1246   ALBUMIN 2.3 (L) 02/19/2020 1246   AST 18 02/19/2020 1246   ALT 15 02/19/2020 1246   ALKPHOS 95 02/19/2020 1246   BILITOT 0.4 02/19/2020 1246   GFRNONAA >60 02/19/2020 1246   GFRNONAA >89 05/08/2016 1907   GFRAA >60 02/19/2020 1246   GFRAA >89 05/08/2016 1907   PEC with severe features IUGR with borderline UAD and NR NST Proceed with primary csection as discussed.

## 2020-02-20 DIAGNOSIS — O411230 Chorioamnionitis, third trimester, not applicable or unspecified: Secondary | ICD-10-CM | POA: Diagnosis not present

## 2020-02-20 DIAGNOSIS — Z3A33 33 weeks gestation of pregnancy: Secondary | ICD-10-CM | POA: Diagnosis not present

## 2020-02-20 LAB — CBC
HCT: 32.3 % — ABNORMAL LOW (ref 36.0–46.0)
Hemoglobin: 10.5 g/dL — ABNORMAL LOW (ref 12.0–15.0)
MCH: 31 pg (ref 26.0–34.0)
MCHC: 32.5 g/dL (ref 30.0–36.0)
MCV: 95.3 fL (ref 80.0–100.0)
Platelets: 149 10*3/uL — ABNORMAL LOW (ref 150–400)
RBC: 3.39 MIL/uL — ABNORMAL LOW (ref 3.87–5.11)
RDW: 12.7 % (ref 11.5–15.5)
WBC: 9.2 10*3/uL (ref 4.0–10.5)
nRBC: 0 % (ref 0.0–0.2)

## 2020-02-20 NOTE — Progress Notes (Signed)
SVD: primary  S:  Pt reports feeling ok/ Tolerating po/ Voiding without problems/ No n/v/ Bleeding is moderate/ Pain controlled withprescription NSAID's including ibuprofen (Motrin) and narcotic analgesics including oxycodone/acetaminophen (Percocet, Tylox) Baby in NICU doing well   O:  A & O x 3 / VS: Blood pressure 125/85, pulse 63, temperature 97.7 F (36.5 C), temperature source Oral, resp. rate 18, height 5\' 6"  (1.676 m), weight 82.1 kg, SpO2 98 %, unknown if currently breastfeeding.  LABS:  Results for orders placed or performed during the hospital encounter of 02/17/20 (from the past 24 hour(s))  CBC     Status: Abnormal   Collection Time: 02/19/20 12:46 PM  Result Value Ref Range   WBC 7.0 4.0 - 10.5 K/uL   RBC 3.63 (L) 3.87 - 5.11 MIL/uL   Hemoglobin 11.6 (L) 12.0 - 15.0 g/dL   HCT 04/20/20 (L) 57.8 - 46.9 %   MCV 95.3 80.0 - 100.0 fL   MCH 32.0 26.0 - 34.0 pg   MCHC 33.5 30.0 - 36.0 g/dL   RDW 62.9 52.8 - 41.3 %   Platelets 149 (L) 150 - 400 K/uL   nRBC 0.0 0.0 - 0.2 %  Comprehensive metabolic panel     Status: Abnormal   Collection Time: 02/19/20 12:46 PM  Result Value Ref Range   Sodium 135 135 - 145 mmol/L   Potassium 3.9 3.5 - 5.1 mmol/L   Chloride 107 98 - 111 mmol/L   CO2 18 (L) 22 - 32 mmol/L   Glucose, Bld 141 (H) 70 - 99 mg/dL   BUN 19 6 - 20 mg/dL   Creatinine, Ser 04/20/20 0.44 - 1.00 mg/dL   Calcium 7.2 (L) 8.9 - 10.3 mg/dL   Total Protein 4.9 (L) 6.5 - 8.1 g/dL   Albumin 2.3 (L) 3.5 - 5.0 g/dL   AST 18 15 - 41 U/L   ALT 15 0 - 44 U/L   Alkaline Phosphatase 95 38 - 126 U/L   Total Bilirubin 0.4 0.3 - 1.2 mg/dL   GFR calc non Af Amer >60 >60 mL/min   GFR calc Af Amer >60 >60 mL/min   Anion gap 10 5 - 15  CBC     Status: Abnormal   Collection Time: 02/20/20  9:16 AM  Result Value Ref Range   WBC 9.2 4.0 - 10.5 K/uL   RBC 3.39 (L) 3.87 - 5.11 MIL/uL   Hemoglobin 10.5 (L) 12.0 - 15.0 g/dL   HCT 04/21/20 (L) 27.2 - 53.6 %   MCV 95.3 80.0 - 100.0 fL   MCH 31.0  26.0 - 34.0 pg   MCHC 32.5 30.0 - 36.0 g/dL   RDW 64.4 03.4 - 74.2 %   Platelets 149 (L) 150 - 400 K/uL   nRBC 0.0 0.0 - 0.2 %    I&O: I/O last 3 completed shifts: In: 6147 [P.O.:1572; I.V.:4575] Out: 4293 [Urine:4065; Blood:228]   Total I/O In: 847 [P.O.:420; I.V.:427] Out: 350 [Urine:350]  Lungs: chest clear, no wheezing, rales, normal symmetric air entry  Heart: regular rate and rhythm, S1, S2 normal, no murmur, click, rub or gallop  Abdomen: soft uterus at umb, surgical tender  Perineum: is normal  Lochia: mod  Extremities:edema 1+    A/P: POD # 1PPD # 1/ 6148 s/p C/S for severe preeclampsia on magnesium sulfate  Doing well  Continue routine post partum orders  D/c magnesium after 24 hrs Cont BP med

## 2020-02-20 NOTE — Lactation Note (Signed)
This note was copied from a baby's chart. Lactation Consultation Note  Patient Name: Allison Riggs FIEPP'I Date: 02/20/2020 Reason for consult: Initial assessment;Preterm <34wks;Infant < 6lbs;NICU baby  P2 mother whose infant is now 43 hours old.  This is a preterm baby born at 33+5 weeks with a CGA of 33+6 weeks, weighing < 6 lbs and in the NICU.  Mother breast fed her two year old for almost two years.    Mother was eating breakfast when I arrived.  She informed me that she also had pre eclampsia with her first child.  Mother has been set up with the DEBP but has not yet started pumping.  Encouraged pumping every 2 1/2- 3 hours.  Asked mother to perform hand expression before/after pumping to help increase milk supply.  Colostrum container provided and milk storage times reviewed.  Mother will take all EBM she obtains to the NICU.  Mother did not wish to review hand expression or pump set up and function.    Suggested she pump in the NICU when she visits.  Explanation provided on how to transport pump parts to the NICU.  Mother has a DEBP for home use.  Father present.  "Providing Breast Milk For Your Baby in the NICU" booklet given.  Guidelines for milk supply and pumping schedule shown in booklet.  Mother appears very sleepy.  Encouraged her to call for any questions/concerns she may have.  Informed her that lactation will be available when the baby is eventually able to latch and we can provide any assistance needed.  Mother verbalized understanding.     Maternal Data Formula Feeding for Exclusion: No Has patient been taught Hand Expression?: Yes Does the patient have breastfeeding experience prior to this delivery?: Yes  Feeding    LATCH Score                   Interventions    Lactation Tools Discussed/Used     Consult Status Consult Status: Follow-up Date: 02/21/20 Follow-up type: In-patient    Carnelia Oscar R Kiah Vanalstine 02/20/2020, 9:29 AM

## 2020-02-21 MED ORDER — LABETALOL HCL 200 MG PO TABS: 300.0000 mg | ORAL_TABLET | Freq: Two times a day (BID) | ORAL | Status: DC

## 2020-02-21 MED ORDER — LABETALOL HCL 200 MG PO TABS
300.0000 mg | ORAL_TABLET | Freq: Three times a day (TID) | ORAL | Status: DC
  Administered 2020-02-21 – 2020-02-24 (×8): 300 mg via ORAL
  Filled 2020-02-21 (×8): qty 1

## 2020-02-21 NOTE — Progress Notes (Addendum)
POD #2 S/p magnesium S: no complaint  O: BP (!) 159/96 (BP Location: Right Arm) Comment: RN notified  Pulse 70   Temp 98.6 F (37 C) (Oral)   Resp 18   Ht 5\' 6"  (1.676 m)   Wt 82.1 kg   SpO2 99%   Breastfeeding Unknown   BMI 29.21 kg/m   Patient Vitals for the past 24 hrs:  BP Temp Temp src Pulse Resp SpO2  02/21/20 0852 (!) 159/96 98.6 F (37 C) Oral 70 18 99 %  02/21/20 0522 (!) 147/92 98.7 F (37.1 C) Oral 68 18 98 %  02/21/20 0125 136/76 98.8 F (37.1 C) Oral 73 18 96 %  02/20/20 1931 (!) 145/95 98.9 F (37.2 C) Oral 68 18 98 %  02/20/20 1513 123/77 97.9 F (36.6 C) Oral 65 18 96 %  02/20/20 1500 -- -- -- -- 18 --  02/20/20 1400 -- -- -- -- 16 --  02/20/20 1300 -- -- -- -- 16 --  02/20/20 1149 125/85 97.7 F (36.5 C) Oral 63 18 98 %  02/20/20 1148 -- -- -- -- 16 --  02/20/20 1100 -- -- -- -- 18 --   Lung clear to A Cor RRR Breast soft pendulous Abd: uterus firm @ umb Pad scant lochia  extr no edema/ DTR 3+ (-) clonus  IMP: POD #2 s/p C/S Pre eclampsia on labetalol S/p magnesium sulfate Depression on wellbutrin/lexapro P) cont pp care. Increase labetalol dose

## 2020-02-21 NOTE — Lactation Note (Signed)
This note was copied from a baby's chart. Lactation Consultation Note  Patient Name: Allison Riggs'D Date: 02/21/2020 Reason for consult: Follow-up assessment  P2 mother whose infant is now 53 hours old.  This is a preterm baby born at 33+5 weeks with a CGA of 34+0 weeks, weighing <6 lbs and in the NICU.  Mother breast fed her two year old for almost two years.  Mother was in the bathroom when I arrived.  Spoke with father regarding baby and mother's pumping.  Father reported that mother has been pumping every three hours and is able to obtain approximately 1 mls of EBM now.  They have been taking the EBM to the NICU.  Parents have not visited the NICU yet today but plan to visit.   Reminded father that STS is very beneficial and that mother may pump in the NICU as discussed yesterday.  Father verbalized understanding and stated that she probably will do that.  Mother has a DEBP for home use.  Father supportive.  Asked him to have mother call if she has any questions/concerns.     Maternal Data    Feeding Feeding Type: Donor Breast Milk  LATCH Score                   Interventions    Lactation Tools Discussed/Used     Consult Status Consult Status: Follow-up Date: 02/22/20 Follow-up type: In-patient    Dora Sims 02/21/2020, 12:28 PM

## 2020-02-21 NOTE — Progress Notes (Signed)
Called Dr Ernestina Penna at [phone] to report patient blood pressure 174/99. Patient dose was changed on her Labetalol from 200 mg to 300 mg BID increased dose is due now.We discussed changing her dose To TID give her increased Labetalol dose now and repeat a blood pressure in an hour. Blood pressure was repeated after an hour and is now 149/81.Patient's DTR's are WNL.

## 2020-02-22 ENCOUNTER — Encounter (HOSPITAL_COMMUNITY): Payer: Self-pay | Admitting: Obstetrics and Gynecology

## 2020-02-22 LAB — COMPREHENSIVE METABOLIC PANEL
ALT: 13 U/L (ref 0–44)
AST: 17 U/L (ref 15–41)
Albumin: 2.1 g/dL — ABNORMAL LOW (ref 3.5–5.0)
Alkaline Phosphatase: 63 U/L (ref 38–126)
Anion gap: 7 (ref 5–15)
BUN: 9 mg/dL (ref 6–20)
CO2: 26 mmol/L (ref 22–32)
Calcium: 8.4 mg/dL — ABNORMAL LOW (ref 8.9–10.3)
Chloride: 105 mmol/L (ref 98–111)
Creatinine, Ser: 0.55 mg/dL (ref 0.44–1.00)
GFR calc Af Amer: >60 mL/min (ref >60)
GFR calc non Af Amer: >60 mL/min (ref >60)
Glucose, Bld: 85 mg/dL (ref 70–99)
Potassium: 4.1 mmol/L (ref 3.5–5.1)
Sodium: 138 mmol/L (ref 135–145)
Total Bilirubin: 0.4 mg/dL (ref 0.3–1.2)
Total Protein: 4.9 g/dL — ABNORMAL LOW (ref 6.5–8.1)

## 2020-02-22 MED ORDER — NIFEDIPINE ER OSMOTIC RELEASE 30 MG PO TB24: 30.0000 mg | ORAL_TABLET | Freq: Every day | ORAL | Status: DC

## 2020-02-22 MED ORDER — NIFEDIPINE ER OSMOTIC RELEASE 30 MG PO TB24
30.0000 mg | ORAL_TABLET | Freq: Once | ORAL | Status: AC
  Administered 2020-02-22: 30 mg via ORAL
  Filled 2020-02-22: qty 1

## 2020-02-22 NOTE — Lactation Note (Signed)
This note was copied from a baby's chart. Lactation Consultation Note  Patient Name: Allison Riggs UDTHY'H Date: 02/22/2020 Reason for consult: Follow-up assessment;NICU baby;Infant < 6lbs;Preterm <34wks  LC in to visit with P4 Mom of [redacted]w[redacted]d infant in the NICU.  Mom is an experienced breastfeeder and one of her other babies was in the NICU also.  Mom aware of importance of frequent double pumping, hand expressing and pumping at the bedside.    Set up separate drying bin for pump parts.    Mom has a Spectra 2 DEBP at home.  Mom's BP is still elevated and discharge held today.   Mom given heat packs to place in her bra while she is eating lunch.  Encouraged her to massage breasts and pump both breasts afterwards.  Mom aware that she can pump in baby's room in the NICU.  Interventions Interventions: Breast feeding basics reviewed;Skin to skin;Breast massage;Hand express;DEBP  Lactation Tools Discussed/Used Tools: Pump Breast pump type: Double-Electric Breast Pump   Consult Status Consult Status: Follow-up Date: 02/23/20 Follow-up type: In-patient    [name] 02/22/2020, 2:14 PM

## 2020-02-22 NOTE — Progress Notes (Signed)
Blood pressure 161/99. Dr Ernestina Penna notified. AM 300 mg Labetalol was given and Procardia 30 mg XL was ordered.

## 2020-02-22 NOTE — Lactation Note (Signed)
This note was copied from a baby's chart. Lactation Consultation Note  Patient Name: Allison Riggs XBOER'Q Date: 02/22/2020   LC attempted to see Mom.  She was sound asleep.  Will check back later.    [name] 02/22/2020, 9:09 AM

## 2020-02-22 NOTE — Progress Notes (Addendum)
Patient ID: Allison Riggs, female   DOB: 05-Feb-1986, 34 y.o.   MRN: [id number] Subjective: POD# 3 Live born female  Birth Weight: 3 lb 7.4 oz (1570 g) APGAR: 5, 8  Newborn Delivery   Birth date/time: 02/19/2020 17:57:00 Delivery type: C-Section, Low Transverse Trial of labor: No C-section categorization: Primary     Baby name: [name] In NICU, stable, prematurity Delivering provider: Olivia Mackie   circumcision planned Feeding: pumping  Pain control at delivery: Spinal   Reports feeling well. Ambulating to NICU w/o difficulty.  Patient reports tolerating PO.   Breast symptoms:+ colostrum Pain controlled with PO meds Denies HA/SOB/C/P/N/V/dizziness. Flatus present, + BM. She reports vaginal bleeding as normal, without clots.  She is ambulating, urinating without difficulty.     Objective:   VS:  Vitals:   02/22/20 0804 02/22/20 1159  BP: (!) 154/96 (!) 143/87  Pulse: 72 89  Resp: 18 18  Temp: 99.1 F (37.3 C) 99.1 F (37.3 C)  SpO2: 97% 96%   .   Intake/Output Summary (Last 24 hours) at 02/22/2020 0937 Last data filed at 02/21/2020 1745 Gross per 24 hour  Intake 820 ml  Output 1200 ml  Net -380 ml        Recent Labs    02/19/20 1246 02/20/20 0916  WBC 7.0 9.2  HGB 11.6* 10.5*  HCT 34.6* 32.3*  PLT 149* 149*   CMP Latest Ref Rng & Units 02/22/2020 02/19/2020 02/17/2020  Glucose 70 - 99 mg/dL 85 102(V) 253(G)  BUN 6 - 20 mg/dL 9 19 16   Creatinine 0.44 - 1.00 mg/dL 6.44 0.34  Sodium 135 - 145 mmol/L 138 135 135  Potassium 3.5 - 5.1 mmol/L 4.1 3.9 3.8  Chloride 98 - 111 mmol/L 105 107 106  CO2 22 - 32 mmol/L 26 18(L) 19(L)  Calcium 8.9 - 10.3 mg/dL 7.42) 7.2(L) 8.5(L)  Total Protein 6.5 - 8.1 g/dL 4.9(L) 4.9(L) 5.1(L)  Total Bilirubin 0.3 - 1.2 mg/dL 0.4 0.4 0.5  Alkaline Phos 38 - 126 U/L 63 95 96  AST 15 - 41 U/L 17 18 18   ALT 0 - 44 U/L 13 15 18     Blood type: --/--/A POS, A POS Performed at [facility], [address], [place],  [facility] [address]  [id number]/08 1934)  Rubella:   immune   Physical Exam:  General: alert, cooperative and no distress Abdomen: soft, nontender, normal bowel sounds Incision: clean, dry and intact Uterine Fundus: firm, below umbilicus, nontender Lochia: minimal Ext: no edema, redness or tenderness in the calves or thighs      Assessment/Plan: 34 y.o.   POD# 3. (                  Principal Problem:   Postpartum care following cesarean delivery 4/9 Active Problems:   Anxiety state  - stable on Wellbutrin and Lexapro   Preeclampsia in postpartum period  - s/p Mag Sulfate x 24 hrs, normal diuresis, CMP repeat and wnl, no neural sx.  - BP severe range overnight  - meds adjusted: labetalol increased 300 TID, Procardia 30 xl daily added  - continue strict I&O and daily weights until discharge   Cesarean delivery - non-reassuring fetal status and severe preeclampsia Newborn stable in NICU  Routine post-op care  31, CNM, MSN 02/22/2020, 9:37 AM

## 2020-02-22 NOTE — Clinical Social Work Maternal (Signed)
CLINICAL SOCIAL WORK MATERNAL/CHILD NOTE  Patient Details  Name: Allison Riggs MRN: [id number] Date of Birth: 1986-05-26  Date:  02/22/2020  Clinical Social Worker Initiating Note:  Blaine Hamper Date/Time: Initiated:  02/22/20/1132     Child's Name:  Allison Riggs   Biological Parents:  Mother, Father   Need for Interpreter:  None   Reason for Referral:  Behavioral Health Concerns   Address:  [address] [place] [address]    Phone number:  [phone] (home)     Additional phone number: FOB's number is [phone]  Household Members/Support Persons (HM/SP):   Household Member/Support Person 1, Household Member/Support Person 2, Household Member/Support Person 3, Household Member/Support Person 4   HM/SP Name Relationship DOB or Age  HM/SP -1 Brain Leth FOB [birth year]84  HM/SP -2 Colton Fussner son 2 yrs of age  HM/SP -3 Jean Rosenthal (last name unknown) son 39 years of age  HM/SP -4 Danne Harbor (last name unknown) daughter 28 years of age  HM/SP -5        HM/SP -6        HM/SP -7        HM/SP -8          Natural Supports (not living in the home):  Extended Family, Immediate Family, Parent   Professional Supports: None   Employment: Unemployed   Type of Work:     Education:  Counsellor arranged:    Surveyor, quantity Resources:  Media planner   Other Resources:      Cultural/Religious Considerations Which May Impact Care: Per [employer] Sheet, MOB is [employer].  Strengths:  Ability to meet basic needs , Pediatrician chosen, Compliance with medical plan , Home prepared for child , Understanding of illness, Psychotropic Medications   Psychotropic Medications:  Lexapro, Wellbutrin      Pediatrician:    Fish farm manager area  Pediatrician List:   [facility] ([address])  [facility]      Pediatrician Fax Number:    Risk  Factors/Current Problems:  Mental Health Concerns    Cognitive State:  Alert , Able to Concentrate , Insightful , Linear Thinking    Mood/Affect:  Flat , Calm , Interested , Comfortable , Relaxed    CSW Assessment: CSW meet with MOB in room 105 to complete an assessment for mental health hx.  When CSW arrived, MOB was in the bed eating breakfast and FOB was in the recliner watching TV.  With MOB's permission, CSW asked FOB to leave the room in order to assess MOB in private; FOB left without incident.  MOB appeared flat however she was polite, easy to engage, and receptive to meeting with CSW.    CSW inquired about MOB's MH and MOB acknowledged a hx of anxiety and depression and reported that she was dx after the birth of her first child. MOB also shared that she has experienced PMAD symptoms after the birth of all her children and her symptoms are currently managed by Wellbutrin and Lexapro. CSW educated MOB about PMADs. CSW informed MOB of possible supports and interventions to decrease PPD.  CSW also encouraged MOB to seek medical attention if needed for increased signs and symptoms of PPD.  CSW also offered MOB resources for outpatient behavioral health services and MOB declined. CSW encouraged MOB to evaluate her mental health throughout the postpartum period with  the use of the New Mom Checklist developed by Postpartum Progress and notify a medical professional if symptoms arise; MOB agreed. MOB presented with insight and awareness and denied SI, HI, and DV when assessed for safety. MOB reported having a good support team that will be willing to help if needed.   CSW reviewed safe sleep and SIDS. MOB and was knowledgeable and asked appropriate questions.   MOB communicated that MOB has everything she needs for the baby and is prepared to meet her infant's needs.  MOB did not have any further questions, concerns, or needs at this time.  CSW thanked MOB for allowing CSW to meet with  her.  CSW will continue to offer resources and supports to family while infant remains in NICU.    There are no barriers to discharge.  CSW Plan/Description:  Psychosocial Support and Ongoing Assessment of Needs, Sudden Infant Death Syndrome (SIDS) Education, Perinatal Mood and Anxiety Disorder (PMADs) Education, Other Patient/Family Education, Other Information/Referral to [employer], MSW, [employer] Clinical Social Work [phone]  Dimple Nanas, LCSW 02/22/2020, 11:36 AM

## 2020-02-23 DIAGNOSIS — O1415 Severe pre-eclampsia, complicating the puerperium: Secondary | ICD-10-CM | POA: Diagnosis not present

## 2020-02-23 LAB — SURGICAL PATHOLOGY

## 2020-02-23 MED ORDER — HYDRALAZINE HCL 20 MG/ML IJ SOLN
5.0000 mg | Freq: Once | INTRAMUSCULAR | Status: AC
  Administered 2020-02-23: 07:00:00 5 mg via INTRAVENOUS
  Filled 2020-02-23: qty 1

## 2020-02-23 MED ORDER — NIFEDIPINE ER OSMOTIC RELEASE 30 MG PO TB24
60.0000 mg | ORAL_TABLET | Freq: Every day | ORAL | Status: DC
  Administered 2020-02-23 – 2020-02-24 (×2): 60 mg via ORAL
  Filled 2020-02-23 (×2): qty 2

## 2020-02-23 NOTE — Lactation Note (Signed)
This note was copied from a baby's chart. Lactation Consultation Note  Patient Name: Allison Riggs HYHOO'I Date: 02/23/2020 Reason for consult: Follow-up assessment;NICU baby;Preterm <34wks;Infant < 6lbs  LC in to visit with P4 Mom of preterm infant in the NICU.  Baby is 32 hrs old.  Mom has PIH and his BP is still elevated and Mom is feeling exhausted.    Encouraged Mom to do the best she can, but to rest also.  Mom states she expressed 10 ml from each breast at last pumping.  Mom pumped at baby's bedside yesterday.  Mom using hand expression as well and has colostrum containers and storage bottles.  Disassembled pump parts, washed, rinsed and placed in separate bin to air dry for Mom.    Reminded Mom of lactation support available for her.    Interventions Interventions: Breast feeding basics reviewed;DEBP;Hand express;Breast massage;Skin to skin  Lactation Tools Discussed/Used Tools: Pump Breast pump type: Double-Electric Breast Pump   Consult Status Consult Status: Follow-up Date: 02/24/20 Follow-up type: In-patient    [name] 02/23/2020, 10:16 AM

## 2020-02-23 NOTE — Progress Notes (Addendum)
POSTOPERATIVE DAY # 4 S/P Primary LTCS for severe PEC  S:         Reports feeling okay. C/o mild headache this morning, but better. Denies visual changes, RUQ/epigastric pain. Emotionally, feeling okay, has had similar situation with last pregnancy and baby in NICU.              Tolerating po intake / no nausea / no vomiting / + flatus / + BM  Denies dizziness, SOB, or CP             Bleeding is light             Pain controlled with Motrin and Percocet             Up ad lib / ambulatory/ voiding QS without difficulty   Newborn in NICU, stable, mom pumping colostrum, feels like milk may be coming in    O:  VS: BP 136/89   Pulse 77   Temp 98.6 F (37 C) (Oral)   Resp 18   Ht 5\' 6"  (1.676 m)   Wt 78 kg   SpO2 98%   Breastfeeding Unknown   BMI 27.77 kg/m  Weight on 4/7: 181lbs Weight on 4/13: 172.05 lbs   LABS:              No results for input(s): WBC, HGB, PLT in the last 72 hours.             Bloodtype: --/--/A POS, A POS Performed at [facility], [address], [place], [place]  [phone]/08 1934)  Rubella:                                               I&O: Intake/Output      04/12 0701 - 04/13 0700 04/13 0701 - 04/14 0700   P.O. 650 120   Total Intake(mL/kg) 650 (8.3) 120 (1.5)   Urine (mL/kg/hr) 1800 (1)    Total Output 1800    Net -1150 +120                     Physical Exam:             Alert and Oriented X3  Lungs: Clear and unlabored  Heart: regular rate and rhythm / no murmurs  Abdomen: soft, non-tender, non-distended, active bowel sounds in all quadrnats             Fundus: firm, non-tender, U-3             Dressing: honeycomb with steri-strips - old brown drainage marked, otherwise c/d/i              Incision:  approximated with sutures / no erythema / no ecchymosis / no drainage  Perineum: intact  Lochia: scant rubra on pad  Extremities: no edema, no calf pain or tenderness             Neuro: nonfocal             Skin: intact  A/P      POD # 4 S/P Primary LTCS            1-Severe Pre-eclampsia with labile HTN   - Severe range BPs this morning with good response to IV Hydralazine   - Current medication regimen: Procardia 60mg  XL daily (increaesd this morning),  Labetalol                                     300mg  TID   -  continue strict I&O and daily weights until discharge  2-Anxiety   - Stable on Lexapro and Wellbutrin    Routine postoperative care              Continue inpatient care today  Anticipatory guidance reviewed for postop recovery  Reassess for d/c in AM

## 2020-02-23 NOTE — Progress Notes (Addendum)
CNM Renae Fickle called re severe range BPs Giving Labetalol protocol IV and added one dose of Hydralazine.   Severe PEC, POD# $, stable labs but BPs back in severe range.  On Labetalol 300mg  q8hr and Procardia 30mg  XL in AM  Patient Vitals for the past 24 hrs:  BP Temp Temp src Pulse Resp SpO2 Weight  02/23/20 0713 (!) 153/104 98.6 F (37 C) Oral 75 -- 99 % --  02/23/20 0605 (!) 161/102 98 F (36.7 C) Oral 71 -- 99 % --  02/23/20 0551 -- -- -- -- -- -- 78 kg  02/23/20 0549 (!) 160/95 -- -- 73 -- -- --  02/22/20 2359 138/87 99.2 F (37.3 C) Oral 80 18 100 % --  02/22/20 1959 (!) 156/93 98.2 F (36.8 C) Oral 88 18 99 % --  02/22/20 1604 136/79 99.3 F (37.4 C) Oral 87 18 96 % --  02/22/20 1343 (!) 155/91 -- -- 84 -- -- --  02/22/20 1159 (!) 143/87 99.1 F (37.3 C) Oral 89 18 96 % --  02/22/20 0804 (!) 154/96 99.1 F (37.3 C) Oral 72 18 97 % --    Increase Procardia to 60mg  XL in AM and continue Labetalol 300mg  q8hrs Repeat BPs per severe range protocol Close observation, remains in-patient until better BP control

## 2020-02-24 MED ORDER — OXYCODONE HCL 5 MG PO TABS: 5.0000 mg | ORAL_TABLET | Freq: Three times a day (TID) | ORAL | 0 refills | Status: AC | PRN

## 2020-02-24 MED ORDER — LABETALOL HCL 300 MG PO TABS: 300.0000 mg | ORAL_TABLET | Freq: Three times a day (TID) | ORAL | 0 refills | Status: DC

## 2020-02-24 MED ORDER — SIMETHICONE 80 MG PO CHEW: 80.0000 mg | CHEWABLE_TABLET | ORAL | 0 refills | Status: DC | PRN

## 2020-02-24 MED ORDER — COCONUT OIL OIL: 1.0000 "application " | TOPICAL_OIL | 0 refills | Status: DC | PRN

## 2020-02-24 MED ORDER — NIFEDIPINE ER 30 MG PO TB24: 60.0000 mg | ORAL_TABLET | Freq: Two times a day (BID) | ORAL | 0 refills | Status: DC

## 2020-02-24 MED ORDER — IBUPROFEN 800 MG PO TABS: 800.0000 mg | ORAL_TABLET | Freq: Four times a day (QID) | ORAL | 0 refills | Status: DC

## 2020-02-24 MED ORDER — SENNOSIDES-DOCUSATE SODIUM 8.6-50 MG PO TABS: 2.0000 | ORAL_TABLET | ORAL | Status: DC

## 2020-02-24 MED ORDER — ACETAMINOPHEN 500 MG PO TABS: 1000.0000 mg | ORAL_TABLET | Freq: Four times a day (QID) | ORAL | 2 refills | Status: AC | PRN

## 2020-02-24 NOTE — Discharge Summary (Signed)
OB Discharge Summary  Patient Name: Allison Riggs DOB: 1986/07/31 MRN: [id number]  Date of admission: 02/17/2020 Delivering provider: Brien Few   Date of discharge: 02/24/2020  Admitting diagnosis: Pre-eclampsia, antepartum [O14.90] Intrauterine pregnancy: [redacted]w[redacted]d     Secondary diagnosis:Principal Problem:   Postpartum care following cesarean delivery 4/9 Active Problems:   Anxiety state   Preeclampsia in postpartum period   Cesarean delivery - non-reassuring fetal status and severe preeclampsia  Additional problems:none     Discharge diagnosis:  Patient Active Problem List   Diagnosis Date Noted  . Postpartum care following cesarean delivery 4/9 02/22/2020  . Cesarean delivery - non-reassuring fetal status and severe preeclampsia 02/22/2020  . Preeclampsia in postpartum period 02/18/2020  . Anxiety state 02/18/2010  . DEPRESSION 02/18/2010  . STONE, URINARY CALCULUS,UNSPEC. 02/18/2010                                                                Post partum procedures:Mag Sulfate therapy  Augmentation: AROM Pain control: Spinal  Laceration:None  Episiotomy:None  Complications: None   Hospital course:  Induction of Labor With Cesarean Section  34 y.o. yo [phone] at [redacted]w[redacted]d was admitted to the hospital 02/17/2020 at 33.[redacted] weeks gestation as per discussion with MFM for serial monitoring, blood pressure monitoring, and possible delivery. The patient did receive betamethasone on 02/04/2020 and 02/05/2020 and was given Magnesium Sulphate prophylaxis for seizures. Patient had a labor course significant for fetal tracing with variable and late decelerations. The patient went for cesarean section due to Non-Reassuring FHR, and delivered a Viable infant,02/19/2020  Membrane Rupture Time/Date: 5:56 PM ,02/19/2020   Details of operation can be found in separate operative Note.  Patient had a complicated postpartum course with rebound severe range blood pressure necessitating titration of  antihypertensive medications over several days. She stabilized after labetalol 300 mg TID and Procardia 60XL daily. Preeclamptic labs serial and grossly normal. She is ambulating, tolerating a regular diet, passing flatus, and urinating well.  Patient is discharged home in stable condition on 02/24/20.                                    Physical exam  Vitals:   02/23/20 1941 02/23/20 2302 02/24/20 0606 02/24/20 0808  BP: 126/71 136/84 137/90 (!) 142/86  Pulse: 92 90 80 73  Resp: 18 17 19 18   Temp: 99.2 F (37.3 C) 98.3 F (36.8 C) 97.8 F (36.6 C) 98.7 F (37.1 C)  TempSrc: Oral Oral Oral Oral  SpO2: 100% 98% 99% 98%  Weight:   79.2 kg   Height:       General: alert, cooperative and no distress Lochia: appropriate Uterine Fundus: firm Incision: Healing well with no significant drainage DVT Evaluation: No cords or calf tenderness. No significant calf/ankle edema. Labs: Lab Results  Component Value Date   WBC 9.2 02/20/2020   HGB 10.5 (L) 02/20/2020   HCT 32.3 (L) 02/20/2020   MCV 95.3 02/20/2020   PLT 149 (L) 02/20/2020   CMP Latest Ref Rng & Units 02/22/2020  Glucose 70 - 99 mg/dL 85  BUN 6 - 20 mg/dL 9  Creatinine 0.44 - 1.00 mg/dL 0.55  Sodium 135 - 145 mmol/L 138  Potassium 3.5 - 5.1 mmol/L 4.1  Chloride 98 - 111 mmol/L 105  CO2 22 - 32 mmol/L 26  Calcium 8.9 - 10.3 mg/dL 4.0(J)  Total Protein 6.5 - 8.1 g/dL 4.9(L)  Total Bilirubin 0.3 - 1.2 mg/dL 0.4  Alkaline Phos 38 - 126 U/L 63  AST 15 - 41 U/L 17  ALT 0 - 44 U/L 13   Edinburgh Postnatal Depression Scale Screening Tool 02/23/2020 02/20/2020  I have been able to laugh and see the funny side of things. (No Data) 0  I have looked forward with enjoyment to things. - 0  I have blamed myself unnecessarily when things went wrong. - 1  I have been anxious or worried for no good reason. - 1  I have felt scared or panicky for no good reason. - 1  Things have been getting on top of me. - 1  I have been so unhappy that  I have had difficulty sleeping. - 0  I have felt sad or miserable. - 0  I have been so unhappy that I have been crying. - 0  The thought of harming myself has occurred to me. - 0  Edinburgh Postnatal Depression Scale Total - 4    Discharge instruction: per After Visit Summary and "Baby and Me Booklet".  After Visit Meds:  Allergies as of 02/24/2020   No Known Allergies     Medication List    STOP taking these medications   aspirin 81 MG chewable tablet     TAKE these medications   acetaminophen 500 MG tablet Commonly known as: TYLENOL Take 2 tablets (1,000 mg total) by mouth every 6 (six) hours as needed.   buPROPion 150 MG 24 hr tablet Commonly known as: WELLBUTRIN XL Take 150 mg by mouth daily.   butalbital-acetaminophen-caffeine 50-325-40 MG tablet Commonly known as: FIORICET Take 1-2 tablets by mouth every 4 (four) hours as needed for headache.   cetirizine 10 MG tablet Commonly known as: ZYRTEC Take 10 mg by mouth daily.   coconut oil Oil Apply 1 application topically as needed.   escitalopram 20 MG tablet Commonly known as: LEXAPRO Take 40 mg by mouth daily.   ibuprofen 800 MG tablet Commonly known as: ADVIL Take 1 tablet (800 mg total) by mouth every 6 (six) hours.   labetalol 300 MG tablet Commonly known as: NORMODYNE Take 1 tablet (300 mg total) by mouth every 8 (eight) hours. What changed:   medication strength  how much to take  when to take this  Another medication with the same name was removed. Continue taking this medication, and follow the directions you see here.   NIFEdipine 30 MG 24 hr tablet Commonly known as: ADALAT CC Take 2 tablets (60 mg total) by mouth in the morning and at bedtime. What changed: how much to take   omeprazole 20 MG capsule Commonly known as: PRILOSEC Take 20 mg by mouth daily.   oxyCODONE 5 MG immediate release tablet Commonly known as: Roxicodone Take 1 tablet (5 mg total) by mouth every 8 (eight) hours  as needed.   prenatal multivitamin Tabs tablet Take 1 tablet by mouth at bedtime.   senna-docusate 8.6-50 MG tablet Commonly known as: Senokot-S Take 2 tablets by mouth daily. Start taking on: February 25, 2020   simethicone 80 MG chewable tablet Commonly known as: MYLICON Chew 1 tablet (80 mg total) by mouth as needed for flatulence.       Diet: routine diet  Activity: Advance as  tolerated. Pelvic rest for 6 weeks.   Postpartum contraception: Vasectomy  Newborn Data: Live born female  Birth Weight: 3 lb 7.4 oz (1570 g) APGAR: 5, 8  Newborn Delivery   Birth date/time: 02/19/2020 17:57:00 Delivery type: C-Section, Low Transverse Trial of labor: No C-section categorization: Primary      named [name] Baby Feeding: Breast Disposition:NICU   Delivery Report:  Review the Delivery Report for details.    Follow up: Follow-up Information    Olivia Mackie, MD. Schedule an appointment as soon as possible for a visit on 02/26/2020.   Specialty: Obstetrics and Gynecology Why: BP check Contact information: [address] [place] [address] [phone]             Signed: Neta Mends, CNM, MSN 02/24/2020, 8:49 AM

## 2020-02-24 NOTE — Progress Notes (Signed)
Subjective: POD# 5 Live born female  Birth Weight: 3 lb 7.4 oz (1570 g) APGAR: 5, 8  Newborn Delivery   Birth date/time: 02/19/2020 17:57:00 Delivery type: C-Section, Low Transverse Trial of labor: No C-section categorization: Primary     Baby name: [name] NICU admit for prematurity, stable and doing well, gaining weight Delivering provider: Olivia Mackie   circumcision planned at NICU discharge Feeding: breast / pumping  Pain control at delivery: Spinal   Reports feeling well, ready for discharge.  Patient reports tolerating PO.   Breast symptoms: milk is in, good supply Pain controlled with PO meds Denies HA/SOB/C/P/N/V/dizziness. Flatus present, + BM. She reports vaginal bleeding as normal, without clots.  She is ambulating, urinating without difficulty.     Objective:   VS:    Vitals:   02/23/20 1700 02/23/20 1941 02/23/20 2302 02/24/20 0606  BP: 117/64 126/71 136/84 137/90  Pulse: 91 92 90 80  Resp: 18 18 17 19   Temp: 98 F (36.7 C) 99.2 F (37.3 C) 98.3 F (36.8 C) 97.8 F (36.6 C)  TempSrc: Oral Oral Oral Oral  SpO2: 97% 100% 98% 99%  Weight:    79.2 kg  Height:          Intake/Output Summary (Last 24 hours) at 02/24/2020 0809 Last data filed at 02/24/2020 0606 Gross per 24 hour  Intake 1000 ml  Output 1450 ml  Net -450 ml     Filed Weights   02/17/20 1432 02/23/20 0551 02/24/20 0606  Weight: 82.1 kg 78 kg 79.2 kg   CBC Latest Ref Rng & Units 02/20/2020 02/19/2020 02/17/2020  WBC 4.0 - 10.5 K/uL 9.2 7.0 6.1  Hemoglobin 12.0 - 15.0 g/dL 10.5(L) 11.6(L) 12.1  Hematocrit 36.0 - 46.0 % 32.3(L) 34.6(L) 36.7  Platelets 150 - 400 K/uL 149(L) 149(L) 147(L)   CMP Latest Ref Rng & Units 02/22/2020 02/19/2020 02/17/2020  Glucose 70 - 99 mg/dL 85 04/18/2020) 263(Z)  BUN 6 - 20 mg/dL 9 19 16   Creatinine 0.44 - 1.00 mg/dL 858(I 5.02  Sodium 135 - 145 mmol/L 138 135 135  Potassium 3.5 - 5.1 mmol/L 4.1 3.9 3.8  Chloride 98 - 111 mmol/L 105 107 106  CO2 22 - 32 mmol/L  26 18(L) 19(L)  Calcium 8.9 - 10.3 mg/dL 7.74) 7.2(L) 8.5(L)  Total Protein 6.5 - 8.1 g/dL 4.9(L) 4.9(L) 5.1(L)  Total Bilirubin 0.3 - 1.2 mg/dL 0.4 0.4 0.5  Alkaline Phos 38 - 126 U/L 63 95 96  AST 15 - 41 U/L 17 18 18   ALT 0 - 44 U/L 13 15 18     Blood type: --/--/A POS, A POS Performed at [facility], [address] [place], [facility]  [phone]/08 1934)  Rubella:   immune   Physical Exam:  General: alert, cooperative and no distress Abdomen: soft, nontender, normal bowel sounds Incision: clean, dry and intact Uterine Fundus: firm, below umbilicus, nontender Lochia: minimal Ext: no edema, redness or tenderness in the calves or thighs   Assessment/Plan: 34 y.o.   POD# 5. [id number]                  Principal Problem:   Postpartum care following cesarean delivery 4/9 Active Problems:   Anxiety state   Preeclampsia in postpartum period   Cesarean delivery - non-reassuring fetal status and severe preeclampsia  BP improved with added Procardia, now on 60XL daily and labetalol 300 mg TID Labs stable, no neural sx Will have close F/U in  office on Friday   Anxiety    - Stable on Lexapro and Wellbutrin               DC home today w/ instructions  F/U at [place] office in 2 days and PRN  Juliene Pina, CNM, MSN 02/24/2020, 8:09 AM

## 2020-02-24 NOTE — Lactation Note (Addendum)
This note was copied from a baby's chart. Lactation Consultation Note  Patient Name: Allison Riggs [id number] Date: 02/24/2020   P51, Baby 72 days old in NICU.  CGA [redacted]w[redacted]d.   Mother is pumping and was happy to express 90 ml this morning. Praised her for her efforts.  Encouraged her to pump at bedside once discharged. Reviewed engorgement care.  Mother has DEBP at home.  Mother has no questions or concerns. Reviewed milk transportation.       Maternal Data    Feeding Feeding Type: Donor Breast Milk  LATCH Score                   Interventions    Lactation Tools Discussed/Used     Consult Status      Hardie Pulley 02/24/2020, 9:23 AM

## 2020-02-26 DIAGNOSIS — Z8759 Personal history of other complications of pregnancy, childbirth and the puerperium: Secondary | ICD-10-CM | POA: Diagnosis not present

## 2020-02-26 DIAGNOSIS — R300 Dysuria: Secondary | ICD-10-CM | POA: Diagnosis not present

## 2020-03-01 ENCOUNTER — Ambulatory Visit: Payer: Self-pay

## 2020-03-01 NOTE — Lactation Note (Signed)
This note was copied from a baby's chart. Lactation Consultation Note  Patient Name: Boy Leanore Biggers [id number] Date: 03/01/2020  Parents request assistance at bedside for feeding.  Mom reports he did well at 12 pm. RN had suggested possibly to take a nipple shield and try him with that to help him maintain.  Attempted with nipple shield first.  Infant cuing and rooting on arrival. Infant fussy at breast and would come off and on with a nipple shield.  Attempt with out shield.  When you tea cup nipple that helps infant to latch and maintain latch.  Infant breastfed off and on for about 12 minutes at the breast.  Had mom to prepump  breast prior to latching.  Able to hand express a few drops of milk to help keep him latched and not so fussy.He tired easily.      Maternal Data    Feeding Feeding Type: Breast Milk  LATCH Score                   Interventions    Lactation Tools Discussed/Used     Consult Status      Harold Mattes Michaelle Copas 03/01/2020, 11:26 PM

## 2020-03-02 ENCOUNTER — Ambulatory Visit: Payer: Self-pay

## 2020-03-02 NOTE — Lactation Note (Signed)
This note was copied from a baby's chart. Lactation Consultation Note  Patient Name: Allison Riggs [id number] Date: 03/02/2020   [facility] spoke with P4 Mom of 12 day old [redacted]w[redacted]d AGA baby that is <4 lbs.  Baby is starting on 72 hr protected breastfeeding.  RN asked for [facility] to talk with Mom.   Mom has an abundant milk supply and is pumping regularly.  Baby roots and tries to latch to breast.   Tried with nipple shield yesterday and "baby didn't like it".  Mom would like lactation assistance at 3 pm today, Threasa Alpha will assist.  Appointment made for 03/03/20 at 12 noon tomorrow as well.  Reassured Mom that as baby grows and matures, he will have some good feedings and some sleepy ones.     [name] 03/02/2020, 1:29 PM

## 2020-03-02 NOTE — Lactation Note (Signed)
This note was copied from a baby's chart. Lactation Consultation Note  Patient Name: Allison Riggs [id number] Date: 03/02/2020 Reason for consult: Follow-up assessment;NICU baby;Infant < 6lbs   Mom bf baby when [facility] entered room.  Mom beginning 72 hour protected bf.  Infant released breast after good 8 minute feed per mom.    Noted moms nipple was creased and white after baby came off the breast.  Mom denies discomfort or pain with bf.    LC burped infant and mom placed his in cross cradle on the left side which she has trouble latching.  LC assisted with latch but with multiple attempts, infant would slip off each time after a few sucks.    LC suggested trying NS again.  NS 20 in room.  Mom was reluctant but she and dad decided to try.  LC taught parents to prefil NS with curved tip syringe.  Mom demonstrated appropriate application of shield.  Infant attempted a few times and then obtained deeped latch and rhythmic sucking followed.  Infant was awake with eyes open and actively sucking. Lips were flanged and gape was wide with no visible NS during bf.   Parents were able to hear swallows.  LC praised mom and dad for efforts pumping, offering the breast, and successfully latching baby [name].  Mom and dad were very pleased with feed.  Infant fell asleep after 15 minutes.    Family understands that some feedings will be better than others and that is it normal for infant to have a good bf then sleep for the next feed.    Mom is aware of lactation support while infant is in NICU.    Mom is doing great and dad is a huge support.  Maternal Data    Feeding Feeding Type: Breast Fed  LATCH Score Latch: Repeated attempts needed to sustain latch, nipple held in mouth throughout feeding, stimulation needed to elicit sucking reflex.  Audible Swallowing: A few with stimulation  Type of Nipple: Everted at rest and after stimulation  Comfort (Breast/Nipple): Soft / non-tender  Hold  (Positioning): Assistance needed to correctly position infant at breast and maintain latch.  LATCH Score: 7  Interventions Interventions: Breast feeding basics reviewed;Skin to skin;Hand express;Adjust position;Position options;Expressed milk  Lactation Tools Discussed/Used Nipple shield size: 20(pre filled with curved tip syringe)   Consult Status Consult Status: Follow-up Date: 03/03/20 Follow-up type: In-patient    Allison Riggs [facility] 03/02/2020, 4:58 PM

## 2020-03-03 ENCOUNTER — Ambulatory Visit: Payer: Self-pay

## 2020-03-03 NOTE — Lactation Note (Signed)
This note was copied from a baby's chart. Lactation Consultation Note  Patient Name: Boy Enma Maeda [id number] Date: 03/03/2020 Reason for consult: Follow-up assessment;Mother's request;Infant < 6lbs;NICU baby  I followed up with Ms. Cary to assist with 12pm feeding. She changed a diaper, and he began to wake up. I assisted with placing him in cross cradle hold on Ms. Crotteau's left breast. She used a size 24 mm nipple shield with some EBM inserted and on top.  Baby latched and suckled weakly with some stronger sucks intermittently. He stayed on the nipple shield/breast for about 9 minutes, but we calculated about 4 minutes of rhythmic suckling.  I praised Ms. Eilers for her skill at positioning baby to the breast, and I aided by providing breast compressions and EBM on the NS. I also educated on developmental readiness for breast feeding and provided feedback that [name]'s breast feeding is developmentally appropriate. She states that she feels encouraged by that, because she does occasionally feel a little frustrated. I reassured that he appears to be making progress.  We reviewed pumping frequency and output. Ms. Bronder pumps every three hours by day and every 4 (to 5) hours at night. She pumps about 2 ounces/breast each pump. I encouraged her to go no longer than 4 hours at night between pumping sessions and to average 8 pumps a day until milk is well established.   Ms. Zell will call lactation later this week when ready for more follow up. All questions answered at this time.   Feeding Feeding Type: Breast Fed  LATCH Score Latch: Repeated attempts needed to sustain latch, nipple held in mouth throughout feeding, stimulation needed to elicit sucking reflex.  Audible Swallowing: A few with stimulation  Type of Nipple: Everted at rest and after stimulation  Comfort (Breast/Nipple): Soft / non-tender  Hold (Positioning): Assistance needed to correctly position infant at breast  and maintain latch.  LATCH Score: 7  Interventions Interventions: Breast feeding basics reviewed;Assisted with latch;Breast compression;Expressed milk  Lactation Tools Discussed/Used Tools: Nipple Shields Nipple shield size: 20 Breast pump type: Double-Electric Breast Pump   Consult Status Consult Status: Follow-up Follow-up type: Call as needed    Walker Shadow 03/03/2020, 12:40 PM

## 2020-03-04 DIAGNOSIS — Z8759 Personal history of other complications of pregnancy, childbirth and the puerperium: Secondary | ICD-10-CM | POA: Diagnosis not present

## 2020-03-07 NOTE — Anesthesia Postprocedure Evaluation (Signed)
Anesthesia Post Note  Patient: Allison Riggs  Procedure(s) Performed: CESAREAN SECTION (N/A )     Patient location during evaluation: PACU Anesthesia Type: Spinal Level of consciousness: awake Pain management: pain level controlled Vital Signs Assessment: post-procedure vital signs reviewed and stable Respiratory status: spontaneous breathing Cardiovascular status: stable Postop Assessment: no headache, no backache, spinal receding, patient able to bend at knees and no apparent nausea or vomiting Anesthetic complications: no    Last Vitals:  Vitals:   02/24/20 0606 02/24/20 0808  BP: 137/90 (!) 142/86  Pulse: 80 73  Resp: 19 18  Temp: 36.6 C 37.1 C  SpO2: 99% 98%    Last Pain:  Vitals:   02/24/20 0920  TempSrc:   PainSc: 5    Pain Goal: Patients Stated Pain Goal: 2 (02/24/20 0606)                 Caren Macadam

## 2020-03-08 ENCOUNTER — Ambulatory Visit: Payer: Self-pay

## 2020-03-08 NOTE — Lactation Note (Signed)
This note was copied from a baby's chart. Lactation Consultation Note  Patient Name: Allison Riggs [id number] Date: 03/08/2020 Reason for consult: Follow-up assessment;NICU baby;Preterm <34wks;Infant < 6lbs LC in to visit with P35 Mom of 57 week old baby at [redacted]w[redacted]d AGA.  Baby's weight <5 lbs.  Mom holding baby STS on her chest.    Asked how she felt feedings were going.  Mom was happy to report that [name] breastfed yesterday for 15 mins before becoming tired.  Mom aware that some feedings will be better than others, but baby is maturing and growing.  Mom using a 20 mm nipple shield which she feels helps baby continuing actively sucking and swallowing.  Baby continues getting gavage fed remainder per algorithm.  Mom states she has not trouble with baby latching, but is anxious to get him home.  Mom is consistently double pumping and has a great milk supply Mom enjoys the time spent with [name], but feels pulled between her home life with 3 other children, one being a 2 yr old.  Listened to Mom and empathized with her.  Praised her for doing as well as she is.  Reminded her of lactation resources available to her post baby's discharge.  She used IBCLC at her Pediatrician's office Research scientist (life sciences)) with her 36 yr old.    Interventions Interventions: Breast feeding basics reviewed;Skin to skin;Breast massage;Hand express;DEBP  Lactation Tools Discussed/Used Tools: Pump;Nipple Dorris Carnes;Bottle Breast pump type: Double-Electric Breast Pump   Consult Status Consult Status: Follow-up Date: 03/14/20 Follow-up type: In-patient    Allison Riggs 03/08/2020, 1:51 PM

## 2020-03-18 DIAGNOSIS — O135 Gestational [pregnancy-induced] hypertension without significant proteinuria, complicating the puerperium: Secondary | ICD-10-CM | POA: Diagnosis not present

## 2020-03-18 DIAGNOSIS — F53 Postpartum depression: Secondary | ICD-10-CM | POA: Diagnosis not present

## 2020-04-03 ENCOUNTER — Inpatient Hospital Stay (HOSPITAL_COMMUNITY): Admit: 2020-04-03 | Payer: Self-pay

## 2020-04-04 DIAGNOSIS — Z1151 Encounter for screening for human papillomavirus (HPV): Secondary | ICD-10-CM | POA: Diagnosis not present

## 2020-04-04 DIAGNOSIS — Z124 Encounter for screening for malignant neoplasm of cervix: Secondary | ICD-10-CM | POA: Diagnosis not present

## 2020-04-05 LAB — HM PAP SMEAR: HM Pap smear: NEGATIVE

## 2020-09-17 IMAGING — US US RENAL
1 series · 14 of 25 positions shown · non-contrast
Comparison: None.

CLINICAL DATA: Right flank pain.

EXAM:
RENAL / URINARY TRACT ULTRASOUND COMPLETE

[Series 1: us renal · 14 of 34 slices shown]
[im 1/34]
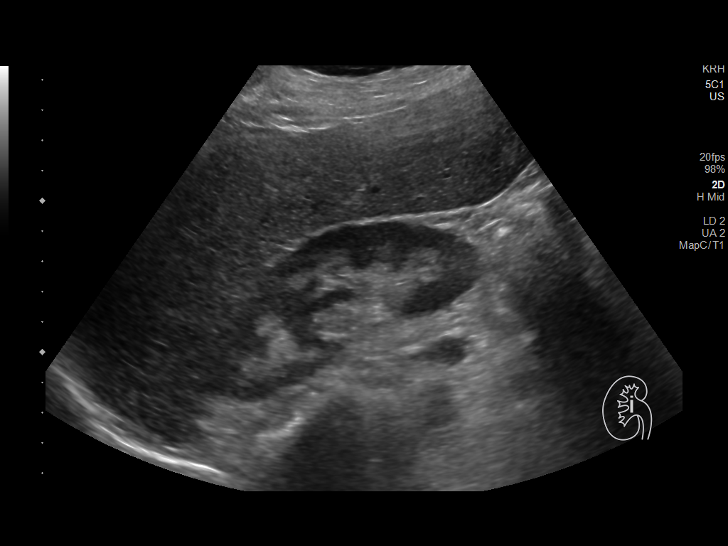
[im 3/34]
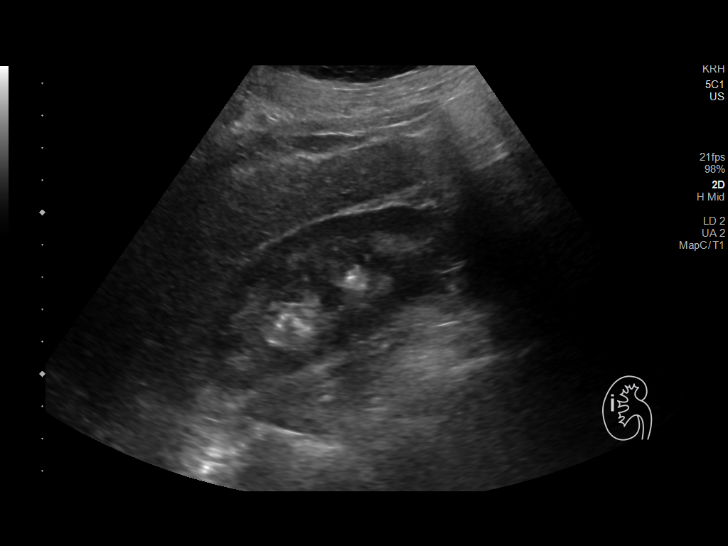
[im 6/34]
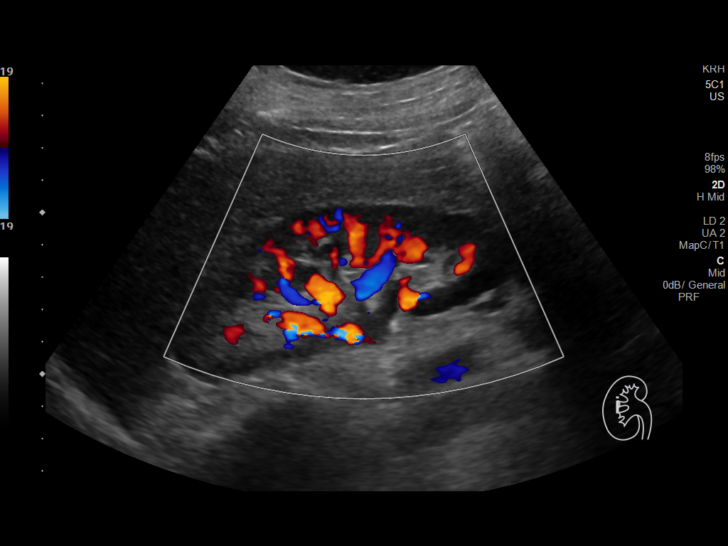
[im 9/34]
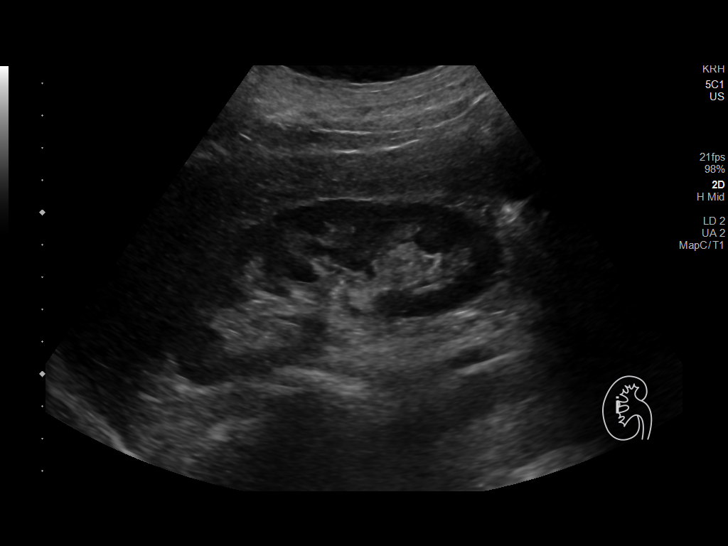
[im 12/34]
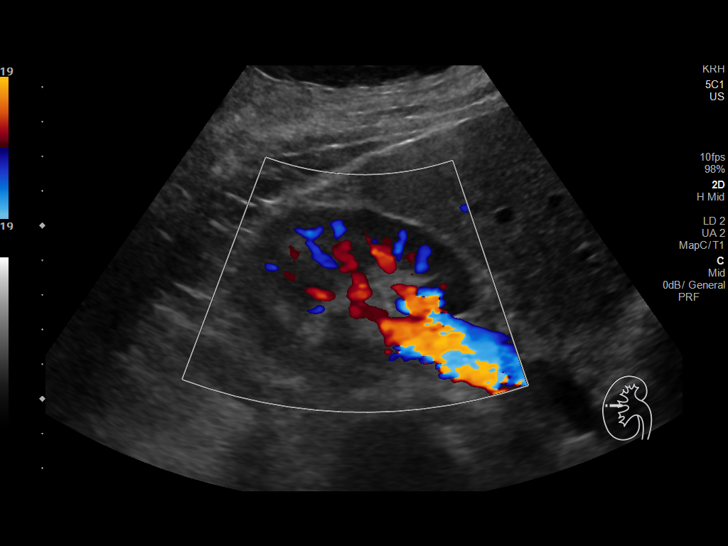
[im 13/34]
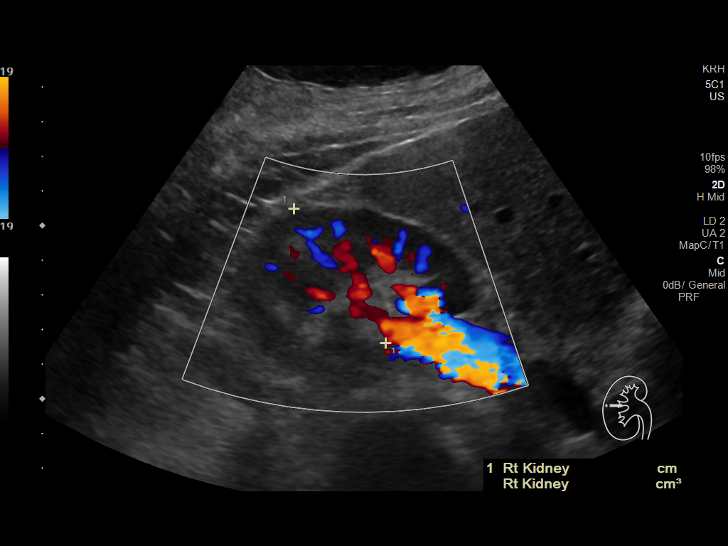
[im 16/34]
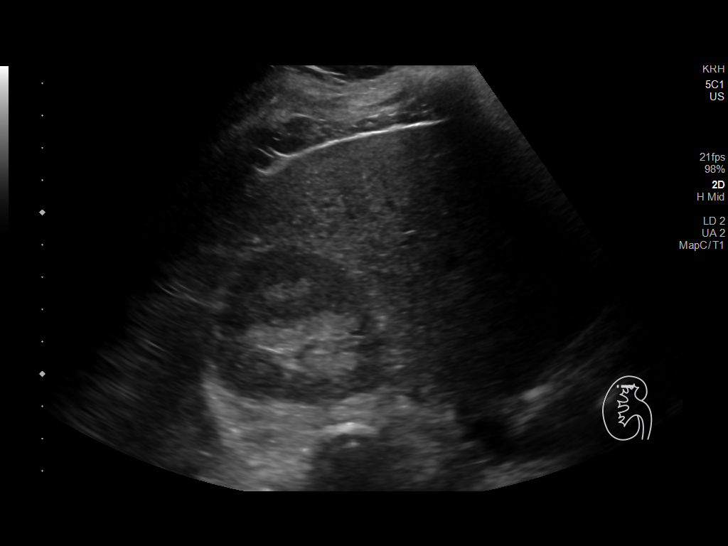
[im 18/34]
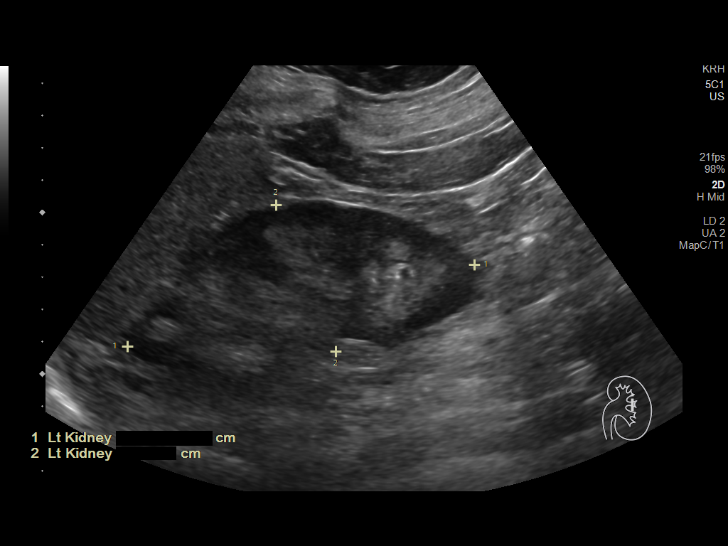
[im 21/34]
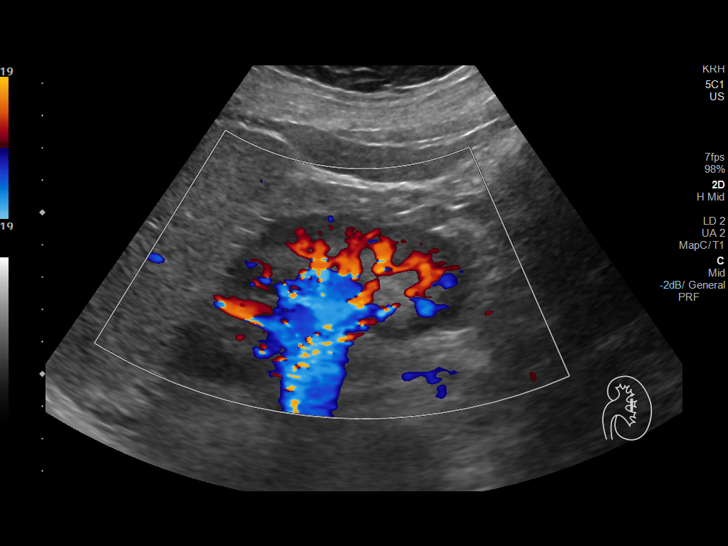
[im 23/34]
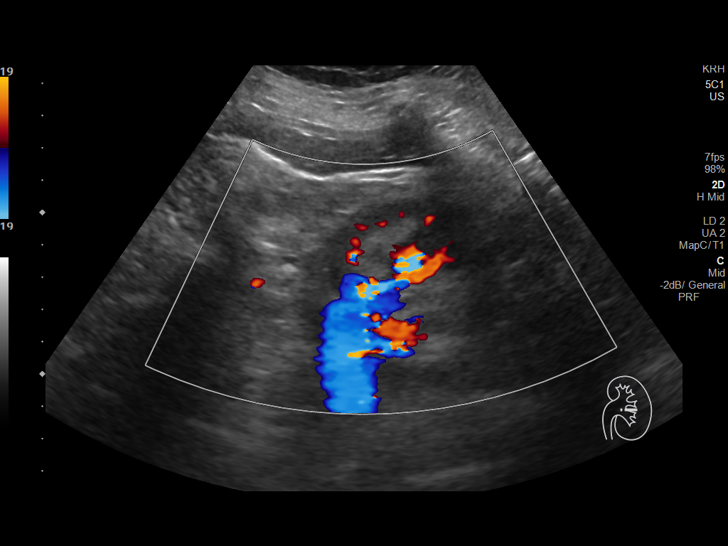
[im 25/34]
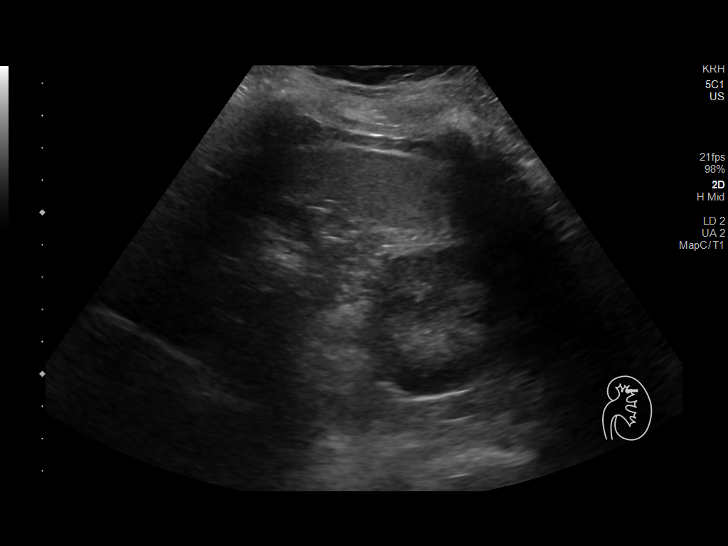
[im 28/34]
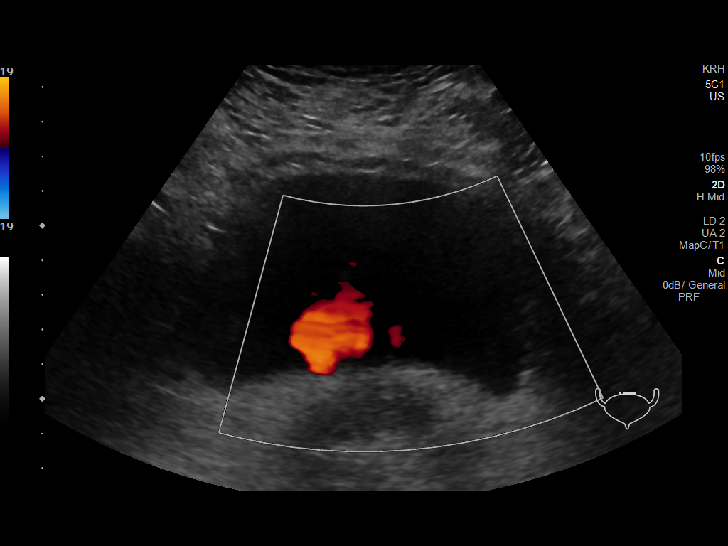
[im 31/34]
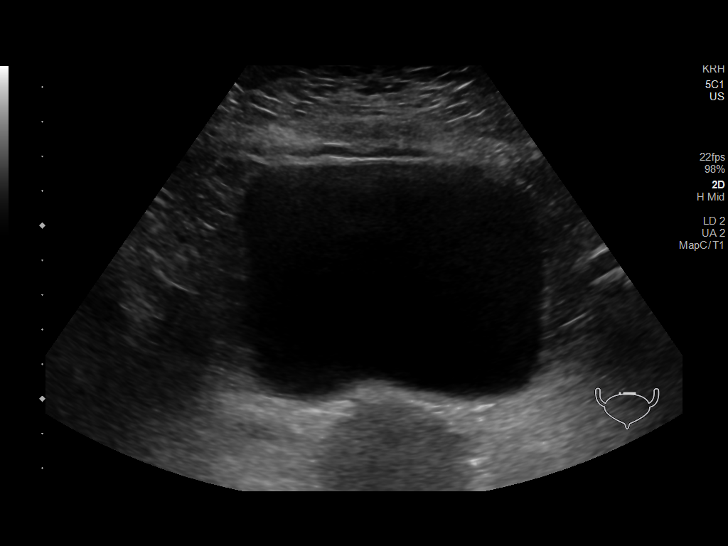
[im 34/34]
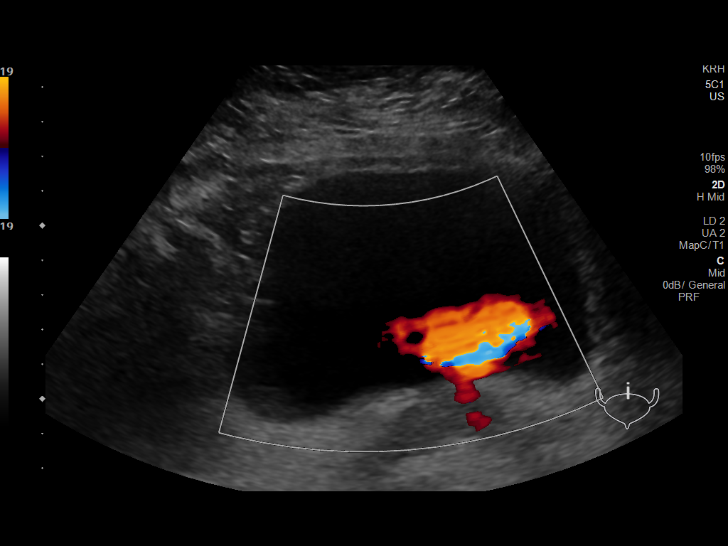

[14 of 25 positions shown; findings below may reference images not displayed]

FINDINGS: Right Kidney:

Renal measurements: 10.9 x 4.3 x 4.2 cm = volume: 115 mL. There is
mild right-sided hydronephrosis.

Left Kidney:

Renal measurements: 11 x 4.9 x 4.9 cm = volume: 137 mL. Echogenicity
within normal limits. No mass or hydronephrosis visualized.

Bladder:

Appears normal for degree of bladder distention. Both ureteral jets
were visualized.
IMPRESSION: 1. Mild right-sided hydronephrosis.
2. Both ureteral jets were visualized.

## 2020-09-17 IMAGING — CR DG ABDOMEN 1V
1 series · 1 of 1 positions shown · non-contrast
Comparison: None.

CLINICAL DATA: Right-sided flank pain

EXAM:
ABDOMEN - 1 VIEW

[t abdomen supine]
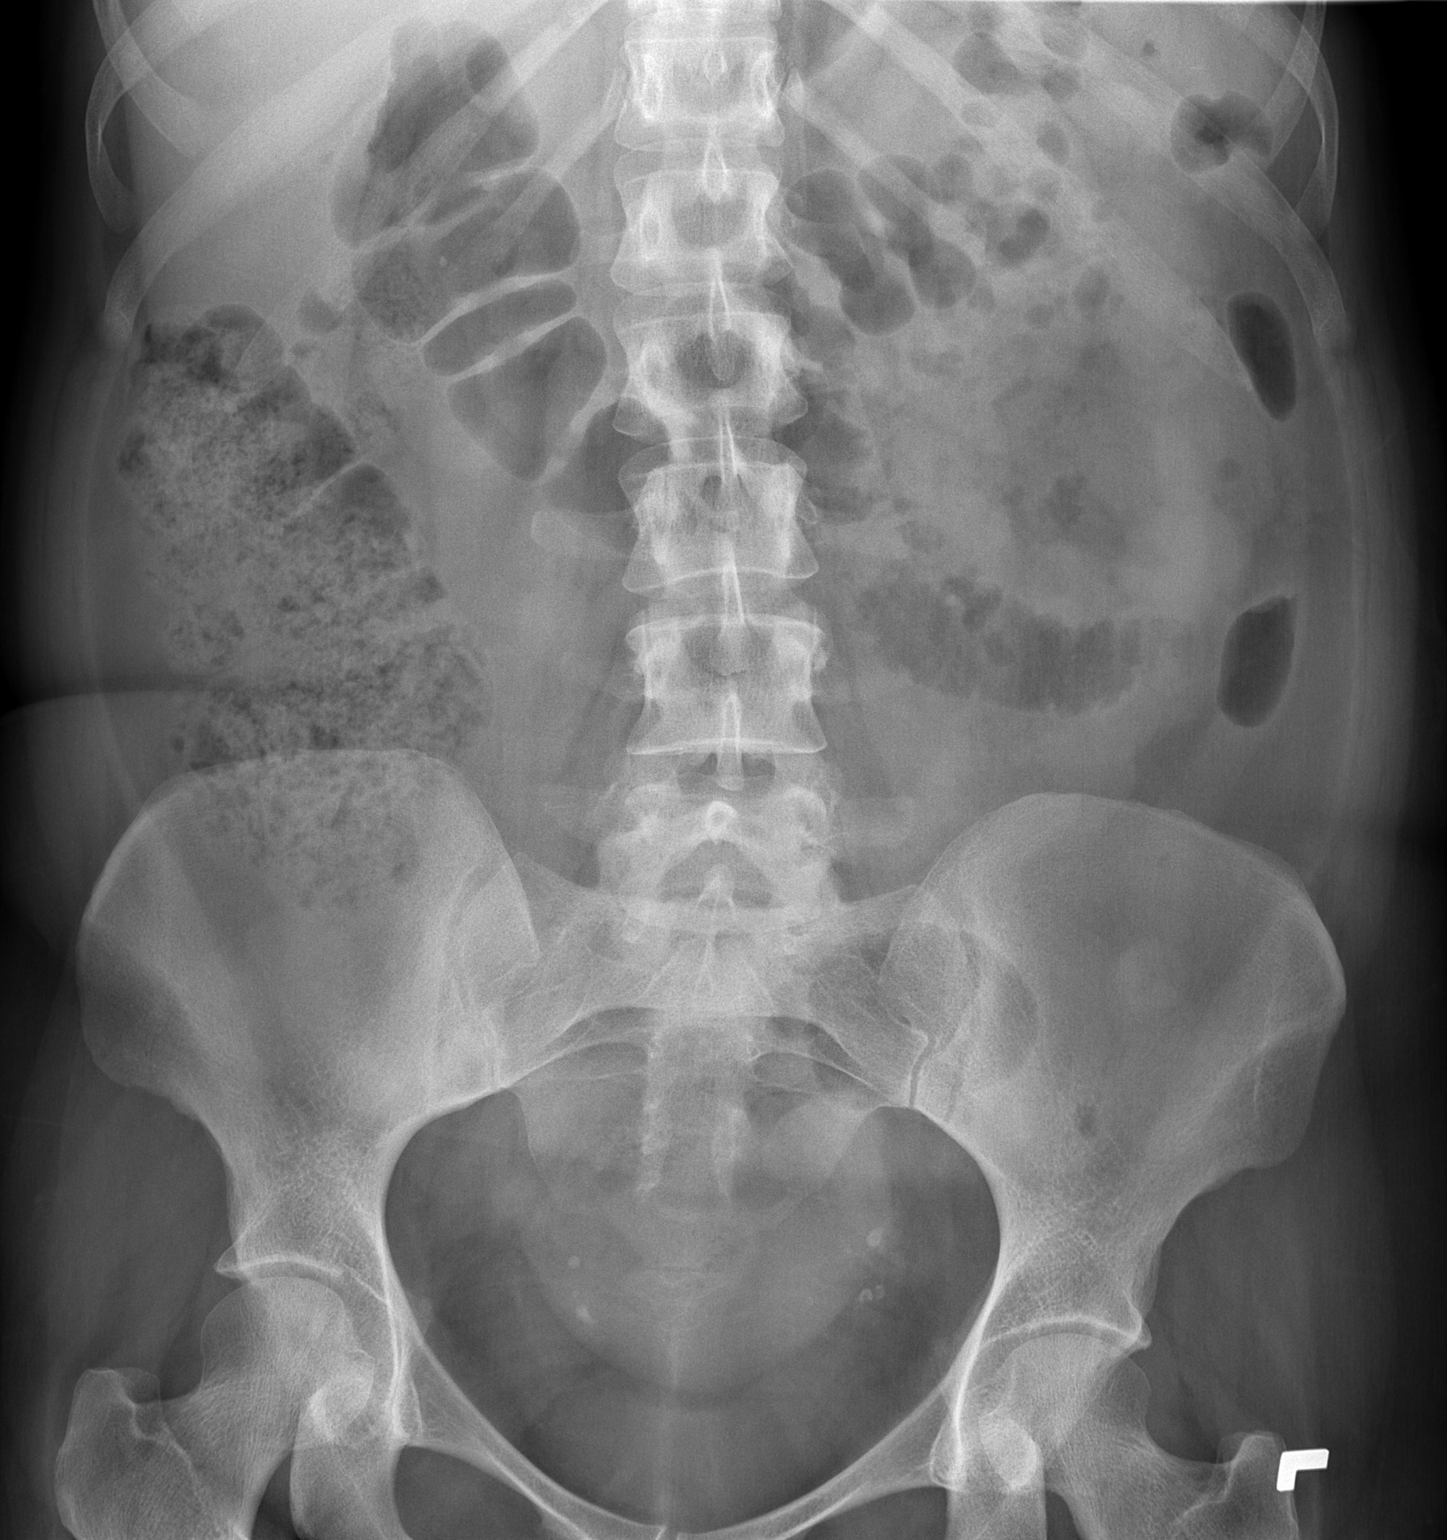

[1 of 1 positions shown; findings below may reference images not displayed]

FINDINGS: There is a moderate amount of stool in the ascending colon. There
are multiple calcifications projecting over the patient's pelvis.
There is a 5 mm calcification in the right hemipelvis that could
represent a phlebolith or distal right ureteral stone. The bowel gas
pattern is nonobstructive. There is a single mildly dilated loop of
small bowel in the left lower quadrant measuring approximately 3 cm
IMPRESSION: 1. There are multiple calcifications projecting over the patient's
pelvis. These are statistically most likely to represent
phleboliths. However, there is a 5 mm calcification in the right
hemipelvis that is suspicious for a distal right ureteral stone in
the appropriate clinical setting.
2. Nonobstructive bowel gas pattern.
3. Moderate amount of stool in the ascending colon.

## 2020-10-17 ENCOUNTER — Emergency Department: Admit: 2020-10-17 | Discharge status: Absent without leave | Payer: Self-pay

## 2020-10-17 ENCOUNTER — Other Ambulatory Visit: Payer: Self-pay

## 2020-10-17 ENCOUNTER — Emergency Department (INDEPENDENT_AMBULATORY_CARE_PROVIDER_SITE_OTHER)
Admission: EM | Admit: 2020-10-17 | Discharge: 2020-10-17 | Disposition: A | Discharge status: Home / Self Care | Payer: PRIVATE HEALTH INSURANCE | Source: Home / Self Care

## 2020-10-17 DIAGNOSIS — M549 Dorsalgia, unspecified: Secondary | ICD-10-CM | POA: Diagnosis not present

## 2020-10-17 DIAGNOSIS — R030 Elevated blood-pressure reading, without diagnosis of hypertension: Secondary | ICD-10-CM

## 2020-10-17 MED ORDER — CYCLOBENZAPRINE HCL 5 MG PO TABS: 5.0000 mg | ORAL_TABLET | Freq: Two times a day (BID) | ORAL | 0 refills | Status: DC | PRN

## 2020-10-17 MED ORDER — METHYLPREDNISOLONE SODIUM SUCC 40 MG IJ SOLR
40.0000 mg | Freq: Once | INTRAMUSCULAR | Status: AC
  Administered 2020-10-17: 40 mg via INTRAMUSCULAR

## 2020-10-17 NOTE — ED Provider Notes (Signed)
[facility]    CSN: [id number] Arrival date & time: 10/17/20  1819      History   Chief Complaint Chief Complaint  Patient presents with  . Back Pain    upper    HPI Allison Riggs is a 34 y.o. female.   HPI  Allison Riggs is a 34 y.o. female presenting to UC with c/o gradually worsening bilateral upper back pain that radiates into her arms. Pain is worse with certain movements including gentle neck stretches. She reports hx of migraines, she had a migraine last week. As it resolved, neck and back pain started. No known injury. Pt is 8 months post-partum and is still nursing. She also carries her child in the infant carrier carseat in her Right arm, wonders if either has contributed to her back pain.  Hx of low back pain, improved with PT in the past.  She has taken Advil and old hydrocodone prescribed for migraines without relief.  She has also tried ice and heat with mild relief.  Denies weakness or numbness in arms or legs. No chest pain, n/v/d. No fever or chills. No urinary symptoms. BP elevated in triage. Hx of pregnancy induced HTN, was advised last month she could discontinue her BP medication.    Past Medical History:  Diagnosis Date  . Anxiety   . Depression   . Pregnancy induced hypertension     Patient Active Problem List   Diagnosis Date Noted  . Postpartum care following cesarean delivery 4/9 02/22/2020  . Cesarean delivery - non-reassuring fetal status and severe preeclampsia 02/22/2020  . Preeclampsia in postpartum period 02/18/2020  . Anxiety state 02/18/2010  . DEPRESSION 02/18/2010  . STONE, URINARY CALCULUS,UNSPEC. 02/18/2010    Past Surgical History:  Procedure Laterality Date  . CESAREAN SECTION N/A 02/19/2020   Procedure: CESAREAN SECTION;  Surgeon: Olivia Mackie, MD;  Location: [facility];  Service: Obstetrics;  Laterality: N/A;  . GANGLION CYST EXCISION    . WISDOM TOOTH EXTRACTION      OB History    Gravida  4   Para  4    Term  1   Preterm  3   AB  0   Living  4     SAB  0   TAB  0   Ectopic  0   Multiple  0   Live Births  4            Home Medications    Prior to Admission medications   Medication Sig Start Date End Date Taking? Authorizing Provider  acetaminophen (TYLENOL) 500 MG tablet Take 2 tablets (1,000 mg total) by mouth every 6 (six) hours as needed. 02/24/20 02/23/21  Neta Mends, CNM  buPROPion (WELLBUTRIN XL) 150 MG 24 hr tablet Take 150 mg by mouth daily.    [provider]  butalbital-acetaminophen-caffeine (FIORICET) 50-325-40 MG tablet Take 1-2 tablets by mouth every 4 (four) hours as needed for headache.  01/29/20   [provider]  cetirizine (ZYRTEC) 10 MG tablet Take 10 mg by mouth daily.    [provider]  coconut oil OIL Apply 1 application topically as needed. 02/24/20   Neta Mends, CNM  cyclobenzaprine (FLEXERIL) 5 MG tablet Take 1 tablet (5 mg total) by mouth 2 (two) times daily as needed for muscle spasms. 10/17/20   Lurene Shadow, PA-C  escitalopram (LEXAPRO) 20 MG tablet Take 40 mg by mouth daily.    [provider]  ibuprofen (  ADVIL) 800 MG tablet Take 1 tablet (800 mg total) by mouth every 6 (six) hours. 02/24/20   Neta Mends, CNM  labetalol (NORMODYNE) 300 MG tablet Take 1 tablet (300 mg total) by mouth every 8 (eight) hours. 02/24/20 03/25/20  Neta Mends, CNM  NIFEdipine (ADALAT CC) 30 MG 24 hr tablet Take 2 tablets (60 mg total) by mouth in the morning and at bedtime. 02/24/20 03/25/20  Neta Mends, CNM  omeprazole (PRILOSEC) 20 MG capsule Take 20 mg by mouth daily.    [provider]  oxyCODONE (ROXICODONE) 5 MG immediate release tablet Take 1 tablet (5 mg total) by mouth every 8 (eight) hours as needed. 02/24/20 02/23/21  Neta Mends, CNM  Prenatal Vit-Fe Fumarate-FA (PRENATAL MULTIVITAMIN) TABS tablet Take 1 tablet by mouth at bedtime.     [provider]  senna-docusate (SENOKOT-S)  8.6-50 MG tablet Take 2 tablets by mouth daily. 02/25/20   Neta Mends, CNM  simethicone (MYLICON) 80 MG chewable tablet Chew 1 tablet (80 mg total) by mouth as needed for flatulence. 02/24/20   Neta Mends, CNM    Family History Family History  Problem Relation Age of Onset  . Healthy Mother   . Healthy Father     Social History Social History   Tobacco Use  . Smoking status: Never Smoker  . Smokeless tobacco: Never Used  Vaping Use  . Vaping Use: Never used  Substance Use Topics  . Alcohol use: Not Currently    Comment: not in pregnancy  . Drug use: Not Currently     Allergies   Patient has no known allergies.   Review of Systems Review of Systems  Musculoskeletal: Positive for back pain, myalgias and neck pain. Negative for neck stiffness.  Skin: Negative for rash.  Neurological: Negative for dizziness, weakness, light-headedness, numbness and headaches.     Physical Exam Triage Vital Signs ED Triage Vitals  Enc Vitals Group     BP 10/17/20 1842 (!) 150/104     Pulse Rate 10/17/20 1842 82     Resp 10/17/20 1842 16     Temp 10/17/20 1842 98.3 F (36.8 C)     Temp Source 10/17/20 1842 Oral     SpO2 10/17/20 1842 97 %     Weight --      Height --      Head Circumference --      Peak Flow --      Pain Score 10/17/20 1840 7     Pain Loc --      Pain Edu? --      Excl. in GC? --    No data found.  Updated Vital Signs BP (!) 146/98 (BP Location: Left Arm)   Pulse 82   Temp 98.3 F (36.8 C) (Oral)   Resp 16   SpO2 97%   Breastfeeding Yes   Visual Acuity Right Eye Distance:   Left Eye Distance:   Bilateral Distance:    Right Eye Near:   Left Eye Near:    Bilateral Near:     Physical Exam Vitals and nursing note reviewed.  Constitutional:      Appearance: Normal appearance. She is well-developed.  HENT:     Head: Normocephalic and atraumatic.  Cardiovascular:     Rate and Rhythm: Normal rate and regular rhythm.  Pulmonary:      Effort: Pulmonary effort is normal. No respiratory distress.     Breath sounds: Normal breath sounds.  Chest:     Chest wall: No tenderness.  Musculoskeletal:        General: Tenderness present. Normal range of motion.     Cervical back: Normal range of motion and neck supple. No rigidity or tenderness.     Comments: No spinal tenderness.  Muscular tenderness to upper trapezius and rhomboid muscles bilaterally.  Full ROM upper and lower extremities with 5/5 strength Normal gait.   Skin:    General: Skin is warm and dry.     Capillary Refill: Capillary refill takes less than 2 seconds.  Neurological:     Mental Status: She is alert and oriented to person, place, and time.     Sensory: No sensory deficit.  Psychiatric:        Behavior: Behavior normal.      UC Treatments / Results  Labs (all labs ordered are listed, but only abnormal results are displayed) Labs Reviewed - No data to display  EKG   Radiology No results found.  Procedures Procedures (including critical care time)  Medications Ordered in UC Medications  methylPREDNISolone sodium succinate (SOLU-MEDROL) 40 mg/mL injection 40 mg (40 mg Intramuscular Given 10/17/20 1928)    Initial Impression / Assessment and Plan / UC Course  I have reviewed the triage vital signs and the nursing notes.  Pertinent labs & imaging results that were available during my care of the patient were reviewed by me and considered in my medical decision making (see chart for details).    Hx and exam c/w muscle strain of back BP elevated in triage Encouraged f/u with PCP later this week for BP recheck given recent discontinuation of BP medication at last post-partum visit. Encouraged f/u Sports Medicine for back pain AVS given  Final Clinical Impressions(s) / UC Diagnoses   Final diagnoses:  Acute upper back pain  Elevated blood pressure reading     Discharge Instructions      You may take 500mg  acetaminophen every 4-6  hours or in combination with ibuprofen 400-600mg  every 6-8 hours as needed for pain and inflammation.  Flexeril (cyclobenzaprine) is a muscle relaxer and may cause drowsiness. Do not drink alcohol, drive, or operate heavy machinery while taking.  Call to schedule a follow up visit with Sports Medicine later this week for recheck of symptoms and additional treatment or referral to physical therapy as needed.  Call to schedule an appointment with primary care this week for blood pressure monitoring. You may need to re-start your blood pressure medication again.      ED Prescriptions    Medication Sig Dispense Auth. Provider   cyclobenzaprine (FLEXERIL) 5 MG tablet Take 1 tablet (5 mg total) by mouth 2 (two) times daily as needed for muscle spasms. 10 tablet , PA-C     I have reviewed the PDMP during this encounter.   Lurene Shadow, Lurene Shadow 10/17/20 1950

## 2020-10-17 NOTE — Discharge Instructions (Addendum)
  You may take 500mg  acetaminophen every 4-6 hours or in combination with ibuprofen 400-600mg  every 6-8 hours as needed for pain and inflammation.  Flexeril (cyclobenzaprine) is a muscle relaxer and may cause drowsiness. Do not drink alcohol, drive, or operate heavy machinery while taking.  Call to schedule a follow up visit with Sports Medicine later this week for recheck of symptoms and additional treatment or referral to physical therapy as needed.  Call to schedule an appointment with primary care this week for blood pressure monitoring. You may need to re-start your blood pressure medication again.

## 2020-10-17 NOTE — ED Triage Notes (Signed)
Patient presents to Urgent Care with complaints of bilateral upper back pain that radiates down both arms and into her pinkies since the past few days. Patient reports she has been taking advil, old hydrocodone, applied heat and ice, and it has not gotten much better.

## 2020-10-25 ENCOUNTER — Ambulatory Visit (INDEPENDENT_AMBULATORY_CARE_PROVIDER_SITE_OTHER): Payer: PRIVATE HEALTH INSURANCE

## 2020-10-25 ENCOUNTER — Other Ambulatory Visit: Payer: Self-pay

## 2020-10-25 ENCOUNTER — Ambulatory Visit (INDEPENDENT_AMBULATORY_CARE_PROVIDER_SITE_OTHER): Payer: PRIVATE HEALTH INSURANCE | Admitting: Sports Medicine

## 2020-10-25 DIAGNOSIS — G8929 Other chronic pain: Secondary | ICD-10-CM | POA: Diagnosis not present

## 2020-10-25 DIAGNOSIS — N62 Hypertrophy of breast: Secondary | ICD-10-CM

## 2020-10-25 DIAGNOSIS — M542 Cervicalgia: Secondary | ICD-10-CM

## 2020-10-25 MED ORDER — IBUPROFEN 800 MG PO TABS: 800.0000 mg | ORAL_TABLET | Freq: Three times a day (TID) | ORAL | 2 refills | Status: AC | PRN

## 2020-10-25 NOTE — Assessment & Plan Note (Signed)
This is a very pleasant 34 year old female, she has a long history of neck pain for many years now, localized left and right sides of the neck with radiation down the left arm in a C8 distribution. She did have a steroid injection sometime ago that provided some relief. No progressive weakness, no constitutional symptoms. 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 Adding prescription strength ibuprofen, home physical therapy, cervical spine x-rays. I will see her back in a month, we can consider an MRI for interventional planning if no better. 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

## 2020-10-25 NOTE — Assessment & Plan Note (Signed)
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 Adding prescription strength ibuprofen, home physical therapy, cervical spine x-rays. I will see her back in a month, we can consider an MRI for interventional planning if no better. 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

## 2020-10-25 NOTE — Progress Notes (Signed)
    Procedures performed today:    None.  Independent interpretation of notes and tests performed by another provider:   None.  Brief History, Exam, Impression, and Recommendations:    Chronic neck pain This is a very pleasant 34 year old female, she has a long history of neck pain for many years now, localized left and right sides of the neck with radiation down the left arm in a C8 distribution. She did have a steroid injection sometime ago that provided some relief. No progressive weakness, no constitutional symptoms. 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 Adding prescription strength ibuprofen, home physical therapy, cervical spine x-rays. I will see her back in a month, we can consider an MRI for interventional planning if no better. 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 Adding prescription strength ibuprofen, home physical therapy, cervical spine x-rays. I will see her back in a month, we can consider an MRI for interventional planning if no better. 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    ___________________________________________ Ihor Austin. Benjamin Stain, M.D., ABFM., CAQSM. Primary Care and Sports [facility]  Adjunct Instructor of Family Medicine  [employer]

## 2020-11-22 ENCOUNTER — Ambulatory Visit: Payer: PRIVATE HEALTH INSURANCE | Admitting: Sports Medicine

## 2020-12-25 ENCOUNTER — Other Ambulatory Visit: Payer: Self-pay

## 2020-12-25 ENCOUNTER — Encounter (HOSPITAL_BASED_OUTPATIENT_CLINIC_OR_DEPARTMENT_OTHER): Payer: Self-pay | Admitting: Emergency Medicine

## 2020-12-25 ENCOUNTER — Emergency Department (HOSPITAL_BASED_OUTPATIENT_CLINIC_OR_DEPARTMENT_OTHER)
Admission: EM | Admit: 2020-12-25 | Discharge: 2020-12-25 | Disposition: A | Discharge status: Home / Self Care | Payer: 59 | Attending: Emergency Medicine | Admitting: Emergency Medicine

## 2020-12-25 DIAGNOSIS — R519 Headache, unspecified: Secondary | ICD-10-CM | POA: Diagnosis present

## 2020-12-25 DIAGNOSIS — G43009 Migraine without aura, not intractable, without status migrainosus: Secondary | ICD-10-CM

## 2020-12-25 LAB — PREGNANCY, URINE: Preg Test, Ur: NEGATIVE

## 2020-12-25 MED ORDER — KETOROLAC TROMETHAMINE 30 MG/ML IJ SOLN
30.0000 mg | Freq: Once | INTRAMUSCULAR | Status: AC
  Administered 2020-12-25: 30 mg via INTRAVENOUS
  Filled 2020-12-25: qty 1

## 2020-12-25 MED ORDER — ONDANSETRON HCL 4 MG/2ML IJ SOLN
4.0000 mg | Freq: Once | INTRAMUSCULAR | Status: AC
  Administered 2020-12-25: 4 mg via INTRAVENOUS
  Filled 2020-12-25: qty 2

## 2020-12-25 MED ORDER — DEXAMETHASONE SODIUM PHOSPHATE 10 MG/ML IJ SOLN
10.0000 mg | Freq: Once | INTRAMUSCULAR | Status: AC
  Administered 2020-12-25: 10 mg via INTRAVENOUS
  Filled 2020-12-25: qty 1

## 2020-12-25 MED ORDER — MAGNESIUM SULFATE 2 GM/50ML IV SOLN
2.0000 g | Freq: Once | INTRAVENOUS | Status: AC
  Administered 2020-12-25: 2 g via INTRAVENOUS
  Filled 2020-12-25: qty 50

## 2020-12-25 NOTE — ED Triage Notes (Signed)
Reports headache since Tuesday, reports starting on left side of her head.Taking hydrocodone with some relief. Now on right side. Reports more intense than usual. Nausea.

## 2020-12-25 NOTE — ED Provider Notes (Signed)
Emergency Department Provider Note   I have reviewed the triage vital signs and the nursing notes.   HISTORY  Chief Complaint Headache   HPI Allison Riggs is a 35 y.o. female with past medical history of migraine headache presents to the emergency department with intermittent headache symptoms over the past several days.  She describes typically unilateral headaches with occasional tearing.  She notes that the pain feels similar to her prior migraine headaches but is more severe than normal.  She denies any fevers or chills.  No upper respiratory infection symptoms.  No numbness or weakness.  She does have some photophobia but no other acute vision change.  She has been trying her home medications including as needed hydrocodone for headaches but no lasting relief is achieved.  She does describe some nausea.  Denies any sudden onset, maximal intensity headache symptoms. No head trauma.   Past Medical History:  Diagnosis Date  . Anxiety   . Depression   . Pregnancy induced hypertension     Patient Active Problem List   Diagnosis Date Noted  . Chronic neck pain 10/25/2020  . Macromastia 10/25/2020  . Postpartum care following cesarean delivery 4/9 02/22/2020  . Cesarean delivery - non-reassuring fetal status and severe preeclampsia 02/22/2020  . Preeclampsia in postpartum period 02/18/2020  . Anxiety state 02/18/2010  . DEPRESSION 02/18/2010  . STONE, URINARY CALCULUS,UNSPEC. 02/18/2010    Past Surgical History:  Procedure Laterality Date  . CESAREAN SECTION N/A 02/19/2020   Procedure: CESAREAN SECTION;  Surgeon: Olivia Mackie, MD;  Location: [facility] OR[facility];  Service: Obstetrics;  Laterality: N/A;  . GANGLION CYST EXCISION    . WISDOM TOOTH EXTRACTION      Allergies Patient has no known allergies.  Family History  Problem Relation Age of Onset  . Healthy Mother   . Healthy Father     Social History Social History   Tobacco Use  . Smoking status: Never Smoker  .  Smokeless tobacco: Never Used  Vaping Use  . Vaping Use: Never used  Substance Use Topics  . Alcohol use: Not Currently    Comment: not in pregnancy  . Drug use: Not Currently    Review of Systems  Constitutional: No fever/chills Eyes: No visual changes. Positive photophobia.  ENT: No sore throat. Cardiovascular: Denies chest pain. Respiratory: Denies shortness of breath. Gastrointestinal: No abdominal pain.  No nausea, no vomiting.  No diarrhea.  No constipation. Genitourinary: Negative for dysuria. Musculoskeletal: Negative for back pain. Skin: Negative for rash. Neurological: Negative for focal weakness or numbness. Positive HA.   10-point ROS otherwise negative.  ____________________________________________   PHYSICAL EXAM:  VITAL SIGNS: ED Triage Vitals  Enc Vitals Group     BP 12/25/20 0244 (!) 162/123     Pulse Rate 12/25/20 0244 92     Resp 12/25/20 0244 16     Temp 12/25/20 0244 97.9 F (36.6 C)     Temp Source 12/25/20 0244 Oral     SpO2 12/25/20 0244 100 %     Weight 12/25/20 0243 156 lb (70.8 kg)   Constitutional: Alert and oriented. Well appearing and in no acute distress. Eyes: Conjunctivae are normal. PERRL. EOMI. Head: Atraumatic. Nose: No congestion/rhinnorhea. Mouth/Throat: Mucous membranes are moist.  Oropharynx non-erythematous. Neck: No stridor.  No meningeal signs. Cardiovascular: Normal rate, regular rhythm. Good peripheral circulation. Grossly normal heart sounds.   Respiratory: Normal respiratory effort.  No retractions. Lungs CTAB. Gastrointestinal: No distention.  Musculoskeletal: No gross deformities of  extremities. Neurologic:  Normal speech and language. No gross focal neurologic deficits are appreciated. Normal CN exam w/o weakness or sensory deficit. Normal finger to nose and heel to shin testing.  Skin:  Skin is warm, dry and intact. No rash noted.  ____________________________________________   LABS (all labs ordered are  listed, but only abnormal results are displayed)  Labs Reviewed  PREGNANCY, URINE   ____________________________________________   PROCEDURES  Procedure(s) performed:   Procedures  None  ____________________________________________   INITIAL IMPRESSION / ASSESSMENT AND PLAN / ED COURSE  Pertinent labs & imaging results that were available during my care of the patient were reviewed by me and considered in my medical decision making (see chart for details).   Patient presents to the emergency department with headache symptoms consistent with her prior migraines although more severe.  She has a normal neurologic exam.  Suspicion for PRES, ICH, SAH is exceeding low.  I do not see indication for emergent head imaging at this time.  Plan for symptom management here along with [facility] Houp-term plan of reestablishing care with neurology.  Cluster headaches are possible but symptoms seem more classic for her migraine.  No features to suspect complicated migraine headache.   On reassessment the patient is feeling improved after medicines given here in the emergency department.  She will continue her home medications for migraine treatment as established by her PCP.  Discussed ED return precautions in detail.  Patient is currently breast-feeding limiting some of her at home options for migraine treatment.    ____________________________________________  FINAL CLINICAL IMPRESSION(S) / ED DIAGNOSES  Final diagnoses:  Migraine without aura and without status migrainosus, not intractable    MEDICATIONS GIVEN DURING THIS VISIT:  Medications  ondansetron (ZOFRAN) injection 4 mg (4 mg Intravenous Given 12/25/20 0338)  magnesium sulfate IVPB 2 g 50 mL (0 g Intravenous Stopped 12/25/20 0413)  dexamethasone (DECADRON) injection 10 mg (10 mg Intravenous Given 12/25/20 0338)  ketorolac (TORADOL) 30 MG/ML injection 30 mg (30 mg Intravenous Given 12/25/20 0411)     Note:  This document was prepared using  Dragon voice recognition software and may include unintentional dictation errors.  Alona Bene, MD, [facility]    Amandine Covino, Arlyss Repress, MD 12/25/20 [phone]

## 2020-12-25 NOTE — Discharge Instructions (Signed)

## 2020-12-28 ENCOUNTER — Ambulatory Visit: Payer: 59 | Admitting: Neurology

## 2021-02-23 ENCOUNTER — Encounter: Payer: Self-pay | Admitting: Neurology

## 2021-02-23 ENCOUNTER — Ambulatory Visit: Payer: 59 | Admitting: Neurology

## 2021-02-27 ENCOUNTER — Ambulatory Visit (INDEPENDENT_AMBULATORY_CARE_PROVIDER_SITE_OTHER): Payer: 59 | Admitting: Diagnostic Neuroimaging

## 2021-02-27 ENCOUNTER — Encounter: Payer: Self-pay | Admitting: Diagnostic Neuroimaging

## 2021-02-27 VITALS — BP 137/92 | HR 85 | Ht 66.0 in | Wt 157.0 lb

## 2021-02-27 DIAGNOSIS — G43109 Migraine with aura, not intractable, without status migrainosus: Secondary | ICD-10-CM | POA: Diagnosis not present

## 2021-02-27 DIAGNOSIS — R519 Headache, unspecified: Secondary | ICD-10-CM

## 2021-02-27 NOTE — Progress Notes (Signed)
Hx of migraines x 9 years, tried/failed imitrex, eletriptan, topamax, sumatriptan, amitriptyine. Triggers: alcohol, heat, over exertion, weather changes. I have nausea, light/sound sensitivity. Sharp stabbing pain in corner of my eye, pain to back of head and may switch sides. Still breast feeding.

## 2021-02-27 NOTE — Patient Instructions (Signed)
MIGRAINE PREVENTION  LIFESTYLE CHANGES -Stop or avoid smoking -Decrease or avoid caffeine / alcohol -Eat and sleep on a regular schedule -Exercise several times per week - Consider meds in future (currently nursing / breast feeding) - erenumab (Aimovig) 70mg  monthly (may increase to 140mg  monthly) - fremanezumab (Ajovy) 225mg  monthly (or 675mg  every 3 months) - galazanezumab (Emgality) 240mg  loading dose; then 120mg  monthly   MIGRAINE RESCUE  - ibuprofen, tylenol as needed - Consider meds in future (currently nursing / breast feeding) - ubrogepant ) 100mg  as needed for breakthrough headache; may repeat x 1 after 2 hours; max 2 tabs per day or 8 per month - rimegepant (Nurtec) 75mg  as needed for breakthrough headache; max 8 per month

## 2021-02-27 NOTE — Progress Notes (Signed)
[facility]  PATIENT: Allison Riggs DOB: 06/08/1986  REFERRING CLINICIAN: Long, Arlyss Repress, MD HISTORY FROM: patient  REASON FOR VISIT: new consult    HISTORICAL  CHIEF COMPLAINT:  Chief Complaint  Patient presents with  . Migraine    Rm 7 New Pt, "migraines x 9 years, tried/failed imitrex, eletriptan, topamax, sumatriptan, amitriptyine"    HISTORY OF PRESENT ILLNESS:   35 year old female here for evaluation of headaches.  History of migraine headaches since 35 years old with unilateral retro-orbital sharp pain, throbbing sensation, nausea and sensitivity to light.  Averaging 4 to 8 days of headache per month.  February patient had worsening headaches lasting several days in a row and went to the hospital for evaluation.  She is diagnosed with migraine and referred for neurology follow-up.  No specific trigger or aggravating factors.  Currently breast-feeding from her last pregnancy/delivery in April 2021.    REVIEW OF SYSTEMS: Full 14 system review of systems performed and negative with exception of: as per HPI.  ALLERGIES: No Known Allergies  HOME MEDICATIONS: Outpatient Medications Prior to Visit  Medication Sig Dispense Refill  . buPROPion (WELLBUTRIN XL) 150 MG 24 hr tablet Take 150 mg by mouth daily.    . cetirizine (ZYRTEC) 10 MG tablet Take 10 mg by mouth daily.    . cyclobenzaprine (FLEXERIL) 5 MG tablet Take 1 tablet (5 mg total) by mouth 2 (two) times daily as needed for muscle spasms. 10 tablet 0  . escitalopram (LEXAPRO) 20 MG tablet Take 40 mg by mouth daily.    Marland Kitchen HYDROcodone-acetaminophen (NORCO/VICODIN) 5-325 MG tablet Take 1-2 tablets by mouth every 6 (six) hours as needed for moderate pain.    Marland Kitchen ibuprofen (ADVIL) 800 MG tablet Take 1 tablet (800 mg total) by mouth every 8 (eight) hours as needed. 90 tablet 2  . Prenatal Vit-Fe Fumarate-FA (PRENATAL MULTIVITAMIN) TABS tablet Take 1 tablet by mouth at bedtime.     .  butalbital-acetaminophen-caffeine (FIORICET) 50-325-40 MG tablet Take 1-2 tablets by mouth every 4 (four) hours as needed for headache.  (Patient not taking: Reported on 02/27/2021)    . labetalol (NORMODYNE) 300 MG tablet Take 1 tablet (300 mg total) by mouth every 8 (eight) hours. 90 tablet 0  . NIFEdipine (ADALAT CC) 30 MG 24 hr tablet Take 2 tablets (60 mg total) by mouth in the morning and at bedtime. 120 tablet 0  . omeprazole (PRILOSEC) 20 MG capsule Take 20 mg by mouth daily. (Patient not taking: Reported on 02/27/2021)    . coconut oil OIL Apply 1 application topically as needed.  0  . senna-docusate (SENOKOT-S) 8.6-50 MG tablet Take 2 tablets by mouth daily.    . simethicone (MYLICON) 80 MG chewable tablet Chew 1 tablet (80 mg total) by mouth as needed for flatulence. 30 tablet 0   No facility-administered medications prior to visit.    PAST MEDICAL HISTORY: Past Medical History:  Diagnosis Date  . Anxiety   . Depression   . Headache   . Pre-eclampsia    x 2  . Pregnancy induced hypertension     PAST SURGICAL HISTORY: Past Surgical History:  Procedure Laterality Date  . CESAREAN SECTION N/A 02/19/2020   Procedure: CESAREAN SECTION;  Surgeon: Olivia Mackie, MD;  Location: [facility];  Service: Obstetrics;  Laterality: N/A;  . GANGLION CYST EXCISION    . WISDOM TOOTH EXTRACTION  2004    FAMILY HISTORY: Family History  Problem Relation Age of Onset  .  Healthy Mother   . Healthy Father   . Headache Father   . Migraines Brother   . Diabetes Maternal Grandmother   . Dementia Maternal Grandmother   . Stroke Maternal Grandfather   . Lymphoma Paternal Grandmother     SOCIAL HISTORY: Social History   Socioeconomic History  . Marital status: Married    Spouse name: Arlys John  . Number of children: 2  . Years of education: Not on file  . Highest education level: Bachelor's degree (e.g., BA, AB, BS)  Occupational History    Comment: home maker  Tobacco Use  . Smoking  status: Never Smoker  . Smokeless tobacco: Never Used  Vaping Use  . Vaping Use: Never used  Substance and Sexual Activity  . Alcohol use: Yes    Comment: 1-2wine a month  . Drug use: Never  . Sexual activity: Not Currently  Other Topics Concern  . Not on file  Social History Narrative   Lives with family   caffeine 1-2 a day    some MAsters work in college   Social Determinants of Corporate investment banker Strain: Not on file  Food Insecurity: Not on file  Transportation Needs: Not on file  Physical Activity: Not on file  Stress: Not on file  Social Connections: Not on file  Intimate Partner Violence: Not on file     PHYSICAL EXAM  GENERAL EXAM/CONSTITUTIONAL: Vitals:  Vitals:   02/27/21 1350  BP: (!) 137/92  Pulse: 85  Weight: 157 lb (71.2 kg)  Height: 5\' 6"  (1.676 m)     Body mass index is 25.34 kg/m. Wt Readings from Last 3 Encounters:  02/27/21 157 lb (71.2 kg)  12/25/20 156 lb (70.8 kg)  02/24/20 174 lb 11.2 oz (79.2 kg)     Patient is in no distress; well developed, nourished and groomed; neck is supple  CARDIOVASCULAR:  Examination of carotid arteries is normal; no carotid bruits  Regular rate and rhythm, no murmurs  Examination of peripheral vascular system by observation and palpation is normal  EYES:  Ophthalmoscopic exam of optic discs and posterior segments is normal; no papilledema or hemorrhages  No exam data present  MUSCULOSKELETAL:  Gait, strength, tone, movements noted in Neurologic exam below  NEUROLOGIC: MENTAL STATUS:  No flowsheet data found.  awake, alert, oriented to person, place and time  recent and remote memory intact  normal attention and concentration  language fluent, comprehension intact, naming intact  fund of knowledge appropriate  CRANIAL NERVE:   2nd - no papilledema on fundoscopic exam  2nd, 3rd, 4th, 6th - pupils equal and reactive to light, visual fields full to confrontation, extraocular  muscles intact, no nystagmus  5th - facial sensation symmetric  7th - facial strength symmetric  8th - hearing intact  9th - palate elevates symmetrically, uvula midline  11th - shoulder shrug symmetric  12th - tongue protrusion midline  MOTOR:   normal bulk and tone, full strength in the BUE, BLE  SENSORY:   normal and symmetric to light touch, temperature, vibration  COORDINATION:   finger-nose-finger, fine finger movements normal  REFLEXES:   deep tendon reflexes present and symmetric  GAIT/STATION:   narrow based gait     DIAGNOSTIC DATA (LABS, IMAGING, TESTING) - I reviewed patient records, labs, notes, testing and imaging myself where available.  Lab Results  Component Value Date   WBC 9.2 02/20/2020   HGB 10.5 (L) 02/20/2020   HCT 32.3 (L) 02/20/2020   MCV  95.3 02/20/2020   PLT 149 (L) 02/20/2020      Component Value Date/Time   NA 138 02/22/2020 1020   K 4.1 02/22/2020 1020   CL 105 02/22/2020 1020   CO2 26 02/22/2020 1020   GLUCOSE 85 02/22/2020 1020   BUN 9 02/22/2020 1020   CREATININE 0.55 02/22/2020 1020   CREATININE 0.74 05/08/2016 1907   CALCIUM 8.4 (L) 02/22/2020 1020   PROT 4.9 (L) 02/22/2020 1020   ALBUMIN 2.1 (L) 02/22/2020 1020   AST 17 02/22/2020 1020   ALT 13 02/22/2020 1020   ALKPHOS 63 02/22/2020 1020   BILITOT 0.4 02/22/2020 1020   GFRNONAA >60 02/22/2020 1020   GFRNONAA >89 05/08/2016 1907   GFRAA >60 02/22/2020 1020   GFRAA >89 05/08/2016 1907   No results found for: CHOL, HDL, LDLCALC, LDLDIRECT, TRIG, CHOLHDL No results found for: YBOF7P No results found for: VITAMINB12 Lab Results  Component Value Date   TSH 2.65 05/08/2016       ASSESSMENT AND PLAN  35 y.o. year old female here with:   Meds tried: imitrex, eletriptan, topamax, amitriptyine  Dx:  1. Migraine with aura and without status migrainosus, not intractable   2. Worsening headaches       PLAN:  WORSENING HEADACHES - check MRI brain     MIGRAINE TREATMENT PLAN:  MIGRAINE PREVENTION  LIFESTYLE CHANGES -Stop or avoid smoking -Decrease or avoid caffeine / alcohol -Eat and sleep on a regular schedule -Exercise several times per week - Consider meds in future (currently nursing / breast feeding) - erenumab (Aimovig) 70mg  monthly (may increase to 140mg  monthly) - fremanezumab (Ajovy) 225mg  monthly (or 675mg  every 3 months) - galazanezumab (Emgality) 240mg  loading dose; then 120mg  monthly   MIGRAINE RESCUE  - ibuprofen, tylenol as needed - Consider meds in future (currently nursing / breast feeding) - ubrogepant ) 100mg  as needed for breakthrough headache; may repeat x 1 after 2 hours; max 2 tabs per day or 8 per month - rimegepant (Nurtec) 75mg  as needed for breakthrough headache; max 8 per month   Orders Placed This Encounter  Procedures  . MR BRAIN WO CONTRAST    Return for pending if symptoms worsen or fail to improve.    , MD 02/27/2021, 2:42 PM Certified in Neurology, Neurophysiology and Neuroimaging  [facility] [address] [place], [place] [phone]

## 2021-02-28 ENCOUNTER — Telehealth: Payer: Self-pay | Admitting: Diagnostic Neuroimaging

## 2021-02-28 NOTE — Telephone Encounter (Signed)
[employer] auth: [id number] (exp. 02/28/21 to 03/29/21) order sent to GI they will reach out to the patient to schedule.

## 2021-03-02 ENCOUNTER — Ambulatory Visit
Admission: RE | Admit: 2021-03-02 | Discharge: 2021-03-02 | Disposition: A | Discharge status: Home / Self Care | Payer: 59 | Source: Ambulatory Visit | Attending: Diagnostic Neuroimaging | Admitting: Diagnostic Neuroimaging

## 2021-03-02 DIAGNOSIS — R519 Headache, unspecified: Secondary | ICD-10-CM

## 2021-03-03 ENCOUNTER — Encounter: Payer: Self-pay | Admitting: Family Medicine

## 2021-03-03 ENCOUNTER — Ambulatory Visit (INDEPENDENT_AMBULATORY_CARE_PROVIDER_SITE_OTHER): Payer: 59 | Admitting: Family Medicine

## 2021-03-03 ENCOUNTER — Other Ambulatory Visit: Payer: Self-pay

## 2021-03-03 VITALS — BP 144/92 | HR 72 | Temp 97.9°F | Ht 65.95 in | Wt 156.5 lb

## 2021-03-03 DIAGNOSIS — M542 Cervicalgia: Secondary | ICD-10-CM

## 2021-03-03 DIAGNOSIS — M545 Low back pain, unspecified: Secondary | ICD-10-CM | POA: Diagnosis not present

## 2021-03-03 DIAGNOSIS — M2550 Pain in unspecified joint: Secondary | ICD-10-CM

## 2021-03-03 DIAGNOSIS — L219 Seborrheic dermatitis, unspecified: Secondary | ICD-10-CM

## 2021-03-03 DIAGNOSIS — F324 Major depressive disorder, single episode, in partial remission: Secondary | ICD-10-CM

## 2021-03-03 DIAGNOSIS — G8929 Other chronic pain: Secondary | ICD-10-CM

## 2021-03-03 MED ORDER — KETOCONAZOLE 2 % EX SHAM: 1.0000 "application " | MEDICATED_SHAMPOO | CUTANEOUS | 0 refills | Status: DC

## 2021-03-03 NOTE — Patient Instructions (Signed)
Great to meet you today! Have labs completed.  Try ketoconazole shampoo, let me know if not improving with this.  Follow up with me in about 2 months.

## 2021-03-04 LAB — URIC ACID: Uric Acid, Serum: 6.4 mg/dL (ref 2.5–7.0)

## 2021-03-04 LAB — TSH+FREE T4: TSH W/REFLEX TO FT4: 1.97 mIU/L

## 2021-03-04 LAB — SEDIMENTATION RATE: Sed Rate: 11 mm/h (ref 0–20)

## 2021-03-04 LAB — C-REACTIVE PROTEIN: CRP: 1.8 mg/L (ref <8.0)

## 2021-03-05 DIAGNOSIS — M2550 Pain in unspecified joint: Secondary | ICD-10-CM | POA: Insufficient documentation

## 2021-03-05 DIAGNOSIS — L219 Seborrheic dermatitis, unspecified: Secondary | ICD-10-CM | POA: Insufficient documentation

## 2021-03-05 NOTE — Assessment & Plan Note (Signed)
She is doing well with lexapro and bupropion.  She will continue this for now.

## 2021-03-05 NOTE — Assessment & Plan Note (Signed)
This is chronic in nature. No radicular signs.  Referral placed for physical therapy.

## 2021-03-05 NOTE — Assessment & Plan Note (Addendum)
Trial of 2% nizoral shampoo.

## 2021-03-05 NOTE — Progress Notes (Signed)
Allison Riggs - 35 y.o. female MRN [id number]  Date of birth: Jun 04, 1986  Subjective Chief Complaint  Patient presents with  . Establish Care    HPI Allison Riggs is a 35 y.o. female here today for initial visit.  She has been in fairly good health.  Pregnancies complicated by pre-eclampsia.  BP is mildly elevated today.  She denies symptoms related to this at this time.    She is taking lexapro and bupropion for management of depression and anxiety.  These are being prescribed by her OB/GYN.  She is doing well at this time.   She has had joint pain in several joints including her wrists, shoulders, neck and legs.  She denies joint swelling, numbness or tingling. It was recommended that she have PT however her insurance didn't cover at the time.  She would be interested in this.  Her OB/GYN did check rheumatoid factor and ANA which were normal.    She has noticed dry, scaly pathes in her hair.  She questions whether this may be psoriasis.   ROS:  A comprehensive ROS was completed and negative except as noted per HPI  No Known Allergies  Past Medical History:  Diagnosis Date  . Anxiety   . Depression   . Headache   . Pre-eclampsia    x 2  . Pregnancy induced hypertension     Past Surgical History:  Procedure Laterality Date  . CESAREAN SECTION N/A 02/19/2020   Procedure: CESAREAN SECTION;  Surgeon: Brien Few, MD;  Location: [facility];  Service: Obstetrics;  Laterality: N/A;  . GANGLION CYST EXCISION    . WISDOM TOOTH EXTRACTION  2004    Social History   Socioeconomic History  . Marital status: Married    Spouse name: Aaron Edelman  . Number of children: 4  . Years of education: Not on file  . Highest education level: Bachelor's degree (e.g., BA, AB, BS)  Occupational History  . 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 Smoking status: Never Smoker  . Smokeless tobacco: Never Used  Vaping Use  . Vaping Use: Never used  Substance and Sexual  Activity  . Alcohol use: Yes    Comment: 1-2wine a month  . Drug use: Never    Comment: Delta 8, CBD  . Sexual activity: Yes    Partners: Male    Birth control/protection: Condom  Other Topics Concern  . Not on file  Social History Narrative   Lives with family   caffeine 1-2 a day    some MAsters work in college   Social Determinants of Radio broadcast assistant Strain: Not on file  Food Insecurity: Not on file  Transportation Needs: Not on file  Physical Activity: Not on file  Stress: Not on file  Social Connections: Not on file    Family History  Problem Relation Age of Onset  . Healthy Mother   . Healthy Father   . Headache Father   . Hypertension Father   . Migraines Brother   . Diabetes Maternal Grandmother   . Dementia Maternal Grandmother   . Stroke Maternal Grandfather   . Lymphoma Paternal Grandmother     Health Maintenance  Topic Date Due  . Hepatitis C Screening  Never done  . HIV Screening  Never done  . PAP SMEAR-Modifier  Never done  . INFLUENZA VACCINE  06/12/2021  . TETANUS/TDAP  02/20/2030  . HPV VACCINES  Aged Out     -----------------------------------------------------------------------------------------------------------------------------------------------------------------------------------------------------------------  Physical Exam BP (!) 144/92 (BP Location: Left Arm, Patient Position: Sitting, Cuff Size: Small)   Pulse 72   Temp 97.9 F (36.6 C)   Ht 5' 5.95" (1.675 m)   Wt 156 lb 8 oz (71 kg)   SpO2 99%   BMI 25.30 kg/m   Physical Exam Constitutional:      Appearance: Normal appearance.  Eyes:     General: No scleral icterus. Cardiovascular:     Rate and Rhythm: Normal rate and regular rhythm.  Pulmonary:     Effort: Pulmonary effort is normal.     Breath sounds: Normal breath sounds.  Musculoskeletal:        General: Tenderness (mild ttp along wrist joints. ) present. No swelling.     Cervical back: Neck supple.   Neurological:     General: No focal deficit present.     Mental Status: She is alert and oriented to person, place, and time.  Psychiatric:        Mood and Affect: Mood normal.        Behavior: Behavior normal.     ------------------------------------------------------------------------------------------------------------------------------------------------------------------------------------------------------------------- Assessment and Plan  MDD (major depressive disorder) She is doing well with lexapro and bupropion.  She will continue this for now.   Chronic neck pain This is chronic in nature. No radicular signs.  Referral placed for physical therapy.    Arthralgia Labs per orders.  Orders Placed This Encounter  Procedures  . TSH + free T4  . Sed Rate (ESR)  . C-reactive protein  . Uric acid  . Ambulatory referral to Physical Therapy    Referral Priority:   Routine    Referral Type:   Physical Medicine    Referral Reason:   Specialty Services Required    Requested Specialty:   Physical Therapy    Number of Visits Requested:   1     Seborrheic dermatitis Trial of 2% nizoral shampoo.    Meds ordered this encounter  Medications  . ketoconazole (NIZORAL) 2 % shampoo    Sig: Apply 1 application topically 2 (two) times a week.    Dispense:  120 mL    Refill:  0    Return in about 8 weeks (around 04/28/2021) for scalp rash, joint pain.    This visit occurred during the SARS-CoV-2 public health emergency.  Safety protocols were in place, including screening questions prior to the visit, additional usage of staff PPE, and extensive cleaning of exam room while observing appropriate contact time as indicated for disinfecting solutions.

## 2021-03-05 NOTE — Assessment & Plan Note (Signed)
Labs per orders.  Orders Placed This Encounter  Procedures  . TSH + free T4  . Sed Rate (ESR)  . C-reactive protein  . Uric acid  . Ambulatory referral to Physical Therapy    Referral Priority:   Routine    Referral Type:   Physical Medicine    Referral Reason:   Specialty Services Required    Requested Specialty:   Physical Therapy    Number of Visits Requested:   1

## 2021-03-10 ENCOUNTER — Encounter: Payer: Self-pay | Admitting: Family Medicine

## 2021-03-13 ENCOUNTER — Telehealth: Payer: Self-pay

## 2021-03-13 NOTE — Telephone Encounter (Signed)
I called patient.  I left a detailed message on her cell phone, per DPR, advising her of unremarkable imaging results and to continue the current plan.  I asked patient to call us back with any questions or concerns.

## 2021-03-13 NOTE — Telephone Encounter (Signed)
-----   Message from Suanne Marker, MD sent at 03/13/2021  4:12 PM EDT ----- Unremarkable imaging results. Please call patient. Continue current plan. -VRP

## 2021-04-28 ENCOUNTER — Ambulatory Visit: Payer: 59 | Admitting: Family Medicine

## 2021-05-08 ENCOUNTER — Encounter (HOSPITAL_BASED_OUTPATIENT_CLINIC_OR_DEPARTMENT_OTHER): Payer: Self-pay

## 2021-05-08 ENCOUNTER — Emergency Department (HOSPITAL_BASED_OUTPATIENT_CLINIC_OR_DEPARTMENT_OTHER)
Admission: EM | Admit: 2021-05-08 | Discharge: 2021-05-08 | Disposition: A | Discharge status: Home / Self Care | Payer: 59 | Attending: Emergency Medicine | Admitting: Emergency Medicine

## 2021-05-08 ENCOUNTER — Other Ambulatory Visit: Payer: Self-pay

## 2021-05-08 ENCOUNTER — Emergency Department (HOSPITAL_BASED_OUTPATIENT_CLINIC_OR_DEPARTMENT_OTHER): Payer: 59

## 2021-05-08 DIAGNOSIS — G43809 Other migraine, not intractable, without status migrainosus: Secondary | ICD-10-CM | POA: Insufficient documentation

## 2021-05-08 DIAGNOSIS — H81399 Other peripheral vertigo, unspecified ear: Secondary | ICD-10-CM | POA: Insufficient documentation

## 2021-05-08 DIAGNOSIS — R42 Dizziness and giddiness: Secondary | ICD-10-CM | POA: Diagnosis present

## 2021-05-08 LAB — PREGNANCY, URINE: Preg Test, Ur: NEGATIVE

## 2021-05-08 MED ORDER — MAGNESIUM SULFATE 2 GM/50ML IV SOLN
2.0000 g | Freq: Once | INTRAVENOUS | Status: AC
  Administered 2021-05-08: 2 g via INTRAVENOUS
  Filled 2021-05-08: qty 50

## 2021-05-08 MED ORDER — KETOROLAC TROMETHAMINE 30 MG/ML IJ SOLN
30.0000 mg | Freq: Once | INTRAMUSCULAR | Status: AC
  Administered 2021-05-08: 30 mg via INTRAVENOUS
  Filled 2021-05-08: qty 1

## 2021-05-08 MED ORDER — MECLIZINE HCL 25 MG PO TABS
25.0000 mg | ORAL_TABLET | Freq: Once | ORAL | Status: AC
  Administered 2021-05-08: 25 mg via ORAL
  Filled 2021-05-08: qty 1

## 2021-05-08 NOTE — ED Notes (Addendum)
Pt unsteady gait. Assisted to restroom with 2 people. RN and support person. Wheelchair present if needed.

## 2021-05-08 NOTE — ED Notes (Signed)
Discharge instructions reviewed. Pt educated on pumping and dumping while taking hydrocodone and the meclizine dose.

## 2021-05-08 NOTE — ED Triage Notes (Signed)
Pt reports sudden onset dizziness and nausea while eating approx 1900. States she called EMS and they took her to [facility] but she left due to the long wait time. States EMS gave her phenergan. Denies other medications this evening.

## 2021-05-08 NOTE — ED Provider Notes (Signed)
[facility] EMERGENCY DEPARTMENT Provider Note   CSN: [id number] Arrival date & time: 05/08/21  0137     History Chief Complaint  Patient presents with   Dizziness   Nausea    Allison Riggs is a 35 y.o. female.  The history is provided by the patient.  Dizziness Quality:  Room spinning Severity:  Severe Onset quality:  Sudden Duration:  1 day Timing:  Constant Progression:  Unchanged Chronicity:  New Context: head movement and standing up   Relieved by:  Nothing Worsened by:  Nothing Ineffective treatments:  None tried Associated symptoms: nausea   Associated symptoms: no tinnitus and no weakness   Risk factors: no hx of vertigo   Patient with migraines presents with typical migraine and vertigo.  No weakness, no numbness, no changes in vision or speech.  No f/c/r.      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 02/18/2010    Past Surgical History:  Procedure Laterality Date   CESAREAN SECTION N/A 02/19/2020   Procedure: CESAREAN SECTION;  Surgeon: Olivia Mackie, MD;  Location: [facility] LD ORS;  Service: Obstetrics;  Laterality: N/A;   GANGLION CYST EXCISION     WISDOM TOOTH EXTRACTION  2004     OB History     Gravida  4   Para  4   Term  1   Preterm  3   AB  0   Living  4      SAB  0   IAB  0   Ectopic  0   Multiple  0   Live Births  4           Family History   Problem Relation Age of Onset   Healthy Mother    Healthy Father    Headache Father    Hypertension Father    Migraines Brother    Diabetes Maternal Grandmother    Dementia Maternal Grandmother    Stroke Maternal Grandfather    Lymphoma Paternal Grandmother     Social History   Tobacco Use   Smoking status: Never   Smokeless tobacco: Never  Vaping Use   Vaping Use: Never used  Substance Use Topics   Alcohol use: Yes    Comment: 1-2wine a month   Drug use: Never    Comment: Delta 8, CBD    Home Medications Prior to Admission medications   Medication Sig Start Date End Date Taking? Authorizing Provider  buPROPion (WELLBUTRIN XL) 150 MG 24 hr tablet Take 150 mg by mouth daily.    [provider]  cetirizine (ZYRTEC) 10 MG tablet Take 10 mg by mouth daily.    [provider]  escitalopram (LEXAPRO) 20 MG tablet Take 40 mg by mouth daily.    [provider]  HYDROcodone-acetaminophen (NORCO/VICODIN) 5-325 MG tablet Take 1-2 tablets by mouth every 6 (six) hours as needed for moderate pain.  [provider]  ibuprofen (ADVIL) 800 MG tablet Take 1 tablet (800 mg total) by mouth every 8 (eight) hours as needed. 10/25/20   Monica Becton, MD  ketoconazole (NIZORAL) 2 % shampoo Apply 1 application topically 2 (two) times a week. 03/06/21   Everrett Coombe, DO  Prenatal Vit-Fe Fumarate-FA (PRENATAL MULTIVITAMIN) TABS tablet Take 1 tablet by mouth at bedtime.     [provider]    Allergies    Patient has no known allergies.  Review of Systems   Review of Systems  Constitutional:  Negative for diaphoresis and fever.  HENT:  Negative for tinnitus.   Eyes:  Negative for redness.  Respiratory:  Negative for wheezing.   Cardiovascular:  Negative for leg swelling.  Gastrointestinal:  Positive for nausea.  Genitourinary:  Negative for difficulty urinating.  Musculoskeletal:  Negative for neck stiffness.  Skin:  Negative for rash.   Neurological:  Positive for dizziness. Negative for facial asymmetry and weakness.  Psychiatric/Behavioral:  Negative for agitation.   All other systems reviewed and are negative.  Physical Exam Updated Vital Signs BP 134/88   Pulse 76   Temp 98.1 F (36.7 C) (Oral)   Resp 14   Ht 5\' 6"  (1.676 m)   Wt 70.3 kg   LMP  (LMP Unknown) Comment: States she is nursing and hasn't had a period  SpO2 97%   Breastfeeding Yes   BMI 25.02 kg/m   Physical Exam Vitals and nursing note reviewed.  Constitutional:      General: She is not in acute distress.    Appearance: Normal appearance.  HENT:     Head: Normocephalic and atraumatic.     Nose: Nose normal.  Eyes:     Extraocular Movements: Extraocular movements intact.     Conjunctiva/sclera: Conjunctivae normal.     Pupils: Pupils are equal, round, and reactive to light.  Cardiovascular:     Rate and Rhythm: Normal rate and regular rhythm.     Pulses: Normal pulses.     Heart sounds: Normal heart sounds.  Pulmonary:     Effort: Pulmonary effort is normal.     Breath sounds: Normal breath sounds.  Abdominal:     General: Abdomen is flat. Bowel sounds are normal.     Palpations: Abdomen is soft.     Tenderness: There is no abdominal tenderness. There is no guarding.  Musculoskeletal:        General: Normal range of motion.     Cervical back: Normal range of motion and neck supple.  Skin:    General: Skin is warm and dry.     Capillary Refill: Capillary refill takes less than 2 seconds.  Neurological:     General: No focal deficit present.     Mental Status: She is alert and oriented to person, place, and time.     Deep Tendon Reflexes: Reflexes normal.  Psychiatric:        Mood and Affect: Mood normal.        Behavior: Behavior normal.    ED Results / Procedures / Treatments   Labs (all labs ordered are listed, but only abnormal results are displayed) Labs Reviewed  PREGNANCY, URINE    EKG None  Radiology CT Head  Wo Contrast  Result Date: 05/08/2021 CLINICAL DATA:  Dizziness EXAM: CT HEAD WITHOUT CONTRAST TECHNIQUE: Contiguous axial images were obtained from the base of the skull through the vertex without intravenous contrast. COMPARISON:  None. FINDINGS: Brain: No acute intracranial  abnormality. Specifically, no hemorrhage, hydrocephalus, mass lesion, acute infarction, or significant intracranial injury. Vascular: No hyperdense vessel or unexpected calcification. Skull: No acute calvarial abnormality. Sinuses/Orbits: No acute findings Other: None IMPRESSION: No acute intracranial abnormality. Electronically Signed   By: Charlett Nose M.D.   On: 05/08/2021 03:01    Procedures Procedures   Medications Ordered in ED Medications  meclizine (ANTIVERT) tablet 25 mg (25 mg Oral Given 05/08/21 0210)  ketorolac (TORADOL) 30 MG/ML injection 30 mg (30 mg Intravenous Given 05/08/21 0210)  magnesium sulfate IVPB 2 g 50 mL (0 g Intravenous Stopped 05/08/21 0259)    ED Course  I have reviewed the triage vital signs and the nursing notes.  Pertinent labs & imaging results that were available during my care of the patient were reviewed by me and considered in my medical decision making (see chart for details).   Treated for migraine and vertigo, follow up with your neurologist for ongoing care.  Better post medication.    Maquita Sandoval was evaluated in Emergency Department on 05/08/2021 for the symptoms described in the history of present illness. She was evaluated in the context of the global COVID-19 pandemic, which necessitated consideration that the patient might be at risk for infection with the SARS-CoV-2 virus that causes COVID-19. Institutional protocols and algorithms that pertain to the evaluation of patients at risk for COVID-19 are in a state of rapid change based on information released by regulatory bodies including the CDC and federal and state organizations. These policies and algorithms were followed  during the patient's care in the ED.  Final Clinical Impression(s) / ED Diagnoses Final diagnoses:  Peripheral vertigo, unspecified laterality  Other migraine without status migrainosus, not intractable    Return for intractable cough, coughing up blood, fevers > 100.4 unrelieved by medication, shortness of breath, intractable vomiting, chest pain, shortness of breath, weakness, numbness, changes in speech, facial asymmetry, abdominal pain, passing out, Inability to tolerate liquids or food, cough, altered mental status or any concerns. No signs of systemic illness or infection. The patient is nontoxic-appearing on exam and vital signs are within normal limits. I have reviewed the triage vital signs and the nursing notes. Pertinent labs & imaging results that were available during my care of the patient were reviewed by me and considered in my medical decision making (see chart for details). After history, exam, and medical workup I feel the patient has been appropriately medically screened and is safe for discharge home. Pertinent diagnoses were discussed with the patient. Patient was given return precautions.  Rx / DC Orders ED Discharge Orders     None        Silvestre Mines, MD 05/08/21 [phone]

## 2021-05-08 NOTE — ED Notes (Signed)
Pt denies chance of pregnancy 

## 2021-05-09 ENCOUNTER — Encounter: Payer: Self-pay | Admitting: Family Medicine

## 2021-05-09 ENCOUNTER — Telehealth: Payer: Self-pay | Admitting: *Deleted

## 2021-05-09 DIAGNOSIS — R42 Dizziness and giddiness: Secondary | ICD-10-CM

## 2021-05-09 MED ORDER — MECLIZINE HCL 12.5 MG PO TABS: 12.5000 mg | ORAL_TABLET | Freq: Two times a day (BID) | ORAL | 0 refills | Status: AC | PRN

## 2021-05-09 MED ORDER — METOCLOPRAMIDE HCL 5 MG PO TABS: 5.0000 mg | ORAL_TABLET | Freq: Two times a day (BID) | ORAL | 0 refills | Status: AC | PRN

## 2021-05-09 NOTE — Telephone Encounter (Signed)
Patient has sent my chart asking for Meclizine and reglan to be prescribed as MD suggested.

## 2021-05-09 NOTE — Telephone Encounter (Signed)
Meds ordered this encounter  Medications   metoCLOPramide (REGLAN) 5 MG tablet    Sig: Take 1 tablet (5 mg total) by mouth 2 (two) times daily as needed for nausea.    Dispense:  30 tablet    Refill:  0   meclizine (ANTIVERT) 12.5 MG tablet    Sig: Take 1 tablet (12.5 mg total) by mouth 2 (two) times daily as needed for dizziness.    Dispense:  30 tablet    Refill:  0    Suanne Marker, MD 05/09/2021, 2:34 PM Certified in Neurology, Neurophysiology and Neuroimaging  [facility] [address] [place], [place] [address] [phone]

## 2021-05-09 NOTE — Telephone Encounter (Signed)
Received my chart asking for migraine infusion. Patient had migraine Sun, still  has lingering head, neck pain.  She was in ED yesterday, received magnesium 2 Gm, meclizine 25 mg, Toradol 30 mg, had CT  head- normal. She is breastfeeding. Will discuss with MD.

## 2021-05-11 ENCOUNTER — Ambulatory Visit (INDEPENDENT_AMBULATORY_CARE_PROVIDER_SITE_OTHER): Payer: 59 | Admitting: Family Medicine

## 2021-05-11 ENCOUNTER — Encounter: Payer: Self-pay | Admitting: Family Medicine

## 2021-05-11 ENCOUNTER — Other Ambulatory Visit: Payer: Self-pay

## 2021-05-11 DIAGNOSIS — R42 Dizziness and giddiness: Secondary | ICD-10-CM | POA: Diagnosis not present

## 2021-05-11 MED ORDER — PREDNISONE 50 MG PO TABS
ORAL_TABLET | ORAL | 0 refills | Status: DC
Start: 2021-05-11 — End: 2021-05-23

## 2021-05-11 NOTE — Patient Instructions (Addendum)
Let's add prednisone for a few days.  Continue meclizine as needed.  If not improving with this let me know.    Vertigo Vertigo is the feeling that you or the things around you are moving when they are not. This feeling can come and go at any time. Vertigo often goes away on its own. This condition can be dangerous if it happens when you are doingactivities like driving or working with machines. Your doctor will do tests to find the cause of your vertigo. These tests willalso help your doctor decide on the best treatment for you. Follow these instructions at home: Eating and drinking     Drink enough fluid to keep your pee (urine) pale yellow. Do not drink alcohol. Activity Return to your normal activities when your doctor says that it is safe. In the morning, first sit up on the side of the bed. When you feel okay, stand slowly while you hold onto something until you know that your balance is fine. Move slowly. Avoid sudden body or head movements or certain positions, as told by your doctor. Use a cane if you have trouble standing or walking. Sit down right away if you feel dizzy. Avoid doing any tasks or activities that can cause danger to you or others if you get dizzy. Avoid bending down if you feel dizzy. Place items in your home so that they are easy for you to reach without bending or leaning over. Do not drive or use machinery if you feel dizzy. General instructions Take over-the-counter and prescription medicines only as told by your doctor. Keep all follow-up visits. Contact a doctor if: Your medicine does not help your vertigo. Your problems get worse or you have new symptoms. You have a fever. You feel like you may vomit (nauseous), or this feeling gets worse. You start to vomit. Your family or friends see changes in how you act. You lose feeling (have numbness) in part of your body. You feel prickling and tingling in a part of your body. Get help right away if: You  are always dizzy. You faint. You get very bad headaches. You get a stiff neck. Bright light starts to bother you. You have trouble moving or talking. You feel weak in your hands, arms, or legs. You have changes in your hearing or in how you see (vision). These symptoms may be an emergency. Get help right away. Call your local emergency services (911 in the [place] Do not wait to see if the symptoms will go away. Do not drive yourself to the hospital. Summary Vertigo is the feeling that you or the things around you are moving when they are not. Your doctor will do tests to find the cause of your vertigo. You may be told to avoid some tasks, positions, or movements. Contact a doctor if your medicine is not helping, or if you have a fever, new symptoms, or a change in how you act. Get help right away if you get very bad headaches, or if you have changes in how you speak, hear, or see. This information is not intended to replace advice given to you by your health care provider. Make sure you discuss any questions you have with your healthcare provider. Document Revised: 09/28/2020 Document Reviewed: 09/28/2020 Elsevier Patient Education  2022 [employer].

## 2021-05-11 NOTE — Progress Notes (Signed)
Allison Riggs - 35 y.o. female MRN [id number]  Date of birth: 02-09-1986  Subjective No chief complaint on file.   HPI Allison Riggs is a 35 y.o. female here today for ED follow up.  She was seen in the ED for complaint of dizziness. Symptoms with fairly sudden onset.  She has worsening symptoms with movement of her head and/or standing up. Thought be related to migraine in the ED. Doesn't feel like her typical migraines.  She was given meclizine and toradol in the ED.  Neurologist added reglan for nausea as well.  She reports that symptoms have improved but not resolved.  She denies headache, ear pain/pressure, vision changes, palpitations.   ROS:  A comprehensive ROS was completed and negative except as noted per HPI  No Known Allergies  Past Medical History:  Diagnosis Date   Anxiety    Depression    Headache    Pre-eclampsia    x 2   Pregnancy induced hypertension     Past Surgical History:  Procedure Laterality Date   CESAREAN SECTION N/A 02/19/2020   Procedure: CESAREAN SECTION;  Surgeon: Olivia Mackie, MD;  Location: [facility] LD ORS;  Service: Obstetrics;  Laterality: N/A;   GANGLION CYST EXCISION     WISDOM TOOTH EXTRACTION  2004    Social History   Socioeconomic History   Marital status: Married    Spouse name: Arlys John   Number of children: 4   Years of education: Not on file   Highest education level: Bachelor's degree (e.g., BA, AB, BS)  Occupational History   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  Tobacco Use   Smoking status: Never   Smokeless tobacco: Never  Vaping Use   Vaping Use: Never used  Substance and Sexual Activity   Alcohol use: Yes    Comment: 1-2wine a month   Drug use: Never    Comment: Delta 8, CBD   Sexual activity: Yes    Partners: Male    Birth control/protection: Condom  Other Topics Concern   Not on file  Social History Narrative   Lives with family   caffeine 1-2 a day    some MAsters work in Automotive engineer   Social  Determinants of Corporate investment banker Strain: Not on file  Food Insecurity: Not on file  Transportation Needs: Not on file  Physical Activity: Not on file  Stress: Not on file  Social Connections: Not on file    Family History  Problem Relation Age of Onset   Healthy Mother    Healthy Father    Headache Father    Hypertension Father    Migraines Brother    Diabetes Maternal Grandmother    Dementia Maternal Grandmother    Stroke Maternal Grandfather    Lymphoma Paternal Grandmother     Health Maintenance  Topic Date Due   HIV Screening  Never done   Hepatitis C Screening  Never done   INFLUENZA VACCINE  06/12/2021   PAP SMEAR-Modifier  04/06/2023   TETANUS/TDAP  02/20/2030   Pneumococcal Vaccine 92-41 Years old  Aged Out   HPV VACCINES  Aged Out     ----------------------------------------------------------------------------------------------------------------------------------------------------------------------------------------------------------------- Physical Exam BP 125/85   Pulse (!) 102   Ht 5\' 6"  (1.676 m)   Wt 147 lb (66.7 kg)   LMP  (LMP Unknown) Comment: States she is nursing and hasn't had a period  BMI 23.73 kg/m   Physical Exam Constitutional:      Appearance: Normal appearance.  HENT:     Head: Normocephalic and atraumatic.  Eyes:     General: No scleral icterus. Cardiovascular:     Rate and Rhythm: Normal rate and regular rhythm.  Pulmonary:     Effort: Pulmonary effort is normal.     Breath sounds: Normal breath sounds.  Musculoskeletal:     Cervical back: Neck supple.  Skin:    General: Skin is warm and dry.  Neurological:     Mental Status: She is alert.     Cranial Nerves: No cranial nerve deficit.     Motor: No weakness.     Comments: Positive dix hallpike to the L  Psychiatric:        Mood and Affect: Mood normal.        Behavior: Behavior normal.     ------------------------------------------------------------------------------------------------------------------------------------------------------------------------------------------------------------------- Assessment and Plan  Vertigo Symptoms consistent with vertigo.  She had normal MRI of the brain in April and normal CT in the ED.  Possible labyrinthitis, adding course of prednisone.  I think this will help with her neck pain as well.  Continue meclizine as needed.   Discussed referral to vestibular rehab if not improving.     Meds ordered this encounter  Medications   predniSONE (DELTASONE) 50 MG tablet    Sig: Take 1 tab po daily for 5 days.    Dispense:  5 tablet    Refill:  0    No follow-ups on file.    This visit occurred during the SARS-CoV-2 public health emergency.  Safety protocols were in place, including screening questions prior to the visit, additional usage of staff PPE, and extensive cleaning of exam room while observing appropriate contact time as indicated for disinfecting solutions.

## 2021-05-11 NOTE — Assessment & Plan Note (Signed)
Symptoms consistent with vertigo.  She had normal MRI of the brain in April and normal CT in the ED.  Possible labyrinthitis, adding course of prednisone.  I think this will help with her neck pain as well.  Continue meclizine as needed.   Discussed referral to vestibular rehab if not improving.

## 2021-05-18 ENCOUNTER — Ambulatory Visit (INDEPENDENT_AMBULATORY_CARE_PROVIDER_SITE_OTHER): Payer: 59 | Admitting: Rehabilitative and Restorative Service Providers"

## 2021-05-18 ENCOUNTER — Other Ambulatory Visit: Payer: Self-pay

## 2021-05-18 DIAGNOSIS — R2689 Other abnormalities of gait and mobility: Secondary | ICD-10-CM

## 2021-05-18 DIAGNOSIS — R42 Dizziness and giddiness: Secondary | ICD-10-CM

## 2021-05-18 DIAGNOSIS — R29818 Other symptoms and signs involving the nervous system: Secondary | ICD-10-CM

## 2021-05-18 NOTE — Patient Instructions (Signed)
Access Code: [email or web address] URL: [email or web address] Date: 05/18/2021 Prepared by: Margretta Ditty  Exercises Standing Gaze Stabilization with Head Rotation - 2 x daily - 7 x weekly - 1 sets - 2 reps - 30 seconds hold Standing Gaze Stabilization with Head Nod - 2 x daily - 7 x weekly - 1 sets - 2 reps - 30 seconds hold

## 2021-05-18 NOTE — Therapy (Signed)
[facility] [place] [address] [place] [address] [place], [place], [address] Phone: [phone]   Fax:  [phone]  Physical Therapy Evaluation  Patient Details  Name: Allison Riggs MRN: [id number] Date of Birth: November 16, 1985 Referring Provider (PT): Everrett Coombe, MD   Encounter Date: 05/18/2021   PT End of Session - 05/18/21 1819     Visit Number 1    Number of Visits 6    Date for PT Re-Evaluation 06/29/21    Authorization Type [employer]    PT Start Time 1620    PT Stop Time 1706    PT Time Calculation (min) 46 min             Past Medical History:  Diagnosis Date   Anxiety    Depression    Headache    Pre-eclampsia    x 2   Pregnancy induced hypertension     Past Surgical History:  Procedure Laterality Date   CESAREAN SECTION N/A 02/19/2020   Procedure: CESAREAN SECTION;  Surgeon: Olivia Mackie, MD;  Location: [facility];  Service: Obstetrics;  Laterality: N/A;   GANGLION CYST EXCISION     WISDOM TOOTH EXTRACTION  2004    There were no vitals filed for this visit.    Subjective Assessment - 05/18/21 1623     Subjective The patient reports onset of vertigo on 05/07/21 after a beach trip and while "working out a migraine".  She was eating and had a sudden onset of dizziness "I couldn't even open my eyes" and had to call EMS.  She had n/v, vertigo, imbalance.  Symptoms improved with meclizine.  She currently cannot move her head comfortably and is having a hard time walking around the house at times.  She tried the prednisone prescribed on 6/30 without much difference.  She had an acute sensation that lasted x 8-10 hours, and since then has been experiencing a 5/10 dizziness that is farily constant in nature.  "I can sit really still and be okay or lay on her right side" which helps.  Any movement provokes symptoms.  She denies double vision, but feels overall that focus is harder.  She has L head pain today and has taken  ibuprofen today.  Migraines occur weekly, but she notes that she is in different phases of a migraine often (one is beginning today).   She notes a buzzing sensation on the L side and a muffled sound (this varies).    Patient Stated Goals Reduce dizziness.    Currently in Pain? Yes    Pain Location Head                [facility] PT Assessment - 05/18/21 1640       Assessment   Medical Diagnosis vertigo    Referring Provider (PT) Everrett Coombe, MD    Onset Date/Surgical Date 05/07/21      Precautions   Precautions None      Restrictions   Weight Bearing Restrictions No      Balance Screen   Has the patient fallen in the past 6 months No    Has the patient had a decrease in activity level because of a fear of falling?  No    Is the patient reluctant to leave their home because of a fear of falling?  No      Home Tourist information centre manager residence    Living Arrangements Spouse/significant other;Children      Prior Function  Level of Independence Independent      Observation/Other Assessments   Focus on Therapeutic Outcomes (FOTO)  48%                    Vestibular Assessment - 05/18/21 1640       Vestibular Assessment   General Observation arrives walking en bloc due to dizziness      Symptom Behavior   Subjective history of current problem sudden onset while eating on 05/07/21    Type of Dizziness  --   sensation of falling, some nausea, occasional spinning   Frequency of Dizziness daily    Duration of Dizziness constant    Symptom Nature Motion provoked;Positional;Constant      Oculomotor Exam   Oculomotor Alignment Normal    Ocular ROM WFLs    Spontaneous Absent    Gaze-induced  Absent    Smooth Pursuits Intact    Saccades Intact      Vestibulo-Ocular Reflex   VOR 1 Head Only (x 1 viewing) 10 reps gaze x 1 a 4/10    Comment head impulse test=positive bilaterally, more pronounced refixation saccade to the left      Visual Acuity    Static Line 8    Dynamic Line 1   >2 line difference indicative of vestibular hypofunction/ dec'd VOR     Positional Testing   Dix-Hallpike Dix-Hallpike Right;Dix-Hallpike Left    Horizontal Canal Testing Horizontal Canal Right;Horizontal Canal Left      Dix-Hallpike Right   Dix-Hallpike Right Duration no dizziness, but nystagmus visible in room light upbeating    Dix-Hallpike Right Symptoms Upbeat Nystagmus      Dix-Hallpike Left   Dix-Hallpike Left Duration mild sense of dizziness with L dix hallpike    Dix-Hallpike Left Symptoms No nystagmus   viewed in room light     Horizontal Canal Right   Horizontal Canal Right Duration none    Horizontal Canal Right Symptoms Normal      Horizontal Canal Left   Horizontal Canal Left Duration none    Horizontal Canal Left Symptoms Normal                Objective measurements completed on examination: See above findings.        Vestibular Treatment/Exercise - 05/18/21 1656       Vestibular Treatment/Exercise   Vestibular Treatment Provided Gaze    Gaze Exercises X1 Viewing Horizontal;X1 Viewing Vertical      X1 Viewing Horizontal   Foot Position standing    Comments card at 3 feet x 30 seconds      X1 Viewing Vertical   Foot Position standing    Comments card at 3 feet x 30 seconds                   PT Education - 05/18/21 1818     Education Details HEP    Person(s) Educated Patient    Methods Explanation;Demonstration;Handout    Comprehension Verbalized understanding;Returned demonstration                 PT Long Term Goals - 05/18/21 1819       PT LONG TERM GOAL #1   Title The patient will be indep with HEP for gaze adaptation, habituation, and multi-sensory balance.    Time 6    Period Weeks    Target Date 06/29/21      PT LONG TERM GOAL #2   Title The patient will improve functional status  score from 48% to > or equal to 59%.    Time 6    Period Weeks    Target Date 06/29/21       PT LONG TERM GOAL #3   Title The patient will improve SVA versus DVA to a < or equal to a 4 line difference demo'ing improved use of VOR.    Time 6    Period Weeks    Target Date 06/29/21      PT LONG TERM GOAL #4   Title The patient will report return to household chores without dizziness.    Time 6    Period Weeks    Target Date 06/29/21                    Plan - 05/18/21 1822     Clinical Impression Statement The patient is a 36 yo female presenting to OP physical therapy s/p a sudden onset of vertigo on 05/07/21.  She presents with impairments in gaze adaptation per + head impulse test bilaterally and 7 line difference on static versus dynamic visual acuity.  She also has motion sensitivity with head/body motion.  Positional testing revealed a longer duration, low amplitude upbeating nystagmus without subjective dizziness to accompany it.  PT did not want to over-examine due to h/o migraines-- will further assess multi-sensory balance, dynamic gait as able. Anticipate h/o migraines leading to slower compensation for vestibular hypofunction    Personal Factors and Comorbidities Comorbidity 1    Comorbidities migraines    Examination-Activity Limitations Bed Mobility;Bend;Reach Overhead    Examination-Participation Restrictions Cleaning;Driving;Meal Prep    Stability/Clinical Decision Making Stable/Uncomplicated    Clinical Decision Making Low    Rehab Potential Good    PT Frequency 1x / week    PT Duration 6 weeks    PT Treatment/Interventions ADLs/Self Care Home Management;Patient/family education;Canalith Repostioning;Vestibular;Neuromuscular re-education;Balance training;Therapeutic exercise;Gait training    PT Next Visit Plan progress gaze adaptation, assess multi-sensory balance, add HEP for balanace, begin habituation for motion sensitivity    PT Home Exercise Plan gaze adaptation    Consulted and Agree with Plan of Care Patient             Patient will  benefit from skilled therapeutic intervention in order to improve the following deficits and impairments:  Dizziness, Decreased activity tolerance, Decreased balance, Impaired flexibility  Visit Diagnosis: Dizziness and giddiness  Other abnormalities of gait and mobility  Other symptoms and signs involving the nervous system     Problem List Patient Active Problem List   Diagnosis Date Noted   Vertigo 05/11/2021   Arthralgia 03/05/2021   Seborrheic dermatitis 03/05/2021   Chronic neck pain 10/25/2020   Macromastia 10/25/2020   Postpartum care following cesarean delivery 4/9 02/22/2020   Cesarean delivery - non-reassuring fetal status and severe preeclampsia 02/22/2020   Preeclampsia in postpartum period 02/18/2020   Anxiety state 02/18/2010   MDD (major depressive disorder) 02/18/2010   STONE, URINARY CALCULUS,UNSPEC. 02/18/2010    Everrett Lacasse, PT 05/18/2021, 6:29 PM  [facility] [address] [place] [address] [place], [place], [address] Phone: [phone]   Fax:  [phone]  Name: Allison Riggs MRN: [id number] Date of Birth: 10-Jul-1986

## 2021-05-23 ENCOUNTER — Ambulatory Visit (INDEPENDENT_AMBULATORY_CARE_PROVIDER_SITE_OTHER): Payer: 59 | Admitting: Family Medicine

## 2021-05-23 ENCOUNTER — Encounter: Payer: Self-pay | Admitting: Family Medicine

## 2021-05-23 ENCOUNTER — Other Ambulatory Visit: Payer: Self-pay

## 2021-05-23 VITALS — BP 153/73 | HR 104 | Temp 97.7°F | Ht 66.0 in | Wt 152.0 lb

## 2021-05-23 DIAGNOSIS — M2550 Pain in unspecified joint: Secondary | ICD-10-CM

## 2021-05-23 DIAGNOSIS — R42 Dizziness and giddiness: Secondary | ICD-10-CM | POA: Diagnosis not present

## 2021-05-23 DIAGNOSIS — R5383 Other fatigue: Secondary | ICD-10-CM | POA: Insufficient documentation

## 2021-05-23 NOTE — Progress Notes (Signed)
Allison Riggs - 35 y.o. female MRN [id number]  Date of birth: [birth year]  Subjective Chief Complaint  Patient presents with   Generalized Body Aches    HPI Allison Riggs is a 35 year old female here today for follow-up of dizziness as well as body aches.  She was referred to vestibular rehab for her vertigo symptoms.  She reports that these are improving.  She has been able to reduce meclizine use.  She does report continued fatigue, generalized body aches, intermittent chills.  She has not had any joint swelling.  She denies fever.  She does report some possible tick bites earlier this year as well as some rashes on her legs a few months ago.  ROS:  A comprehensive ROS was completed and negative except as noted per HPI  No Known Allergies  Past Medical History:  Diagnosis Date   Anxiety    Depression    Headache    Pre-eclampsia    x 2   Pregnancy induced hypertension     Past Surgical History:  Procedure Laterality Date   CESAREAN SECTION N/A 02/19/2020   Procedure: CESAREAN SECTION;  Surgeon: Brien Few, MD;  Location: [place] LD ORS;  Service: Obstetrics;  Laterality: N/A;   GANGLION CYST EXCISION     WISDOM TOOTH EXTRACTION  2004    Social History   Socioeconomic History   Marital status: Married    Spouse name: Aaron Edelman   Number of children: 4   Years of education: Not on file   Highest education level: Bachelor's degree (e.g., BA, AB, BS)  Occupational History   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  Tobacco Use   Smoking status: Never   Smokeless tobacco: Never  Vaping Use   Vaping Use: Never used  Substance and Sexual Activity   Alcohol use: Yes    Comment: 1-2wine a month   Drug use: Never    Comment: [employer] 8, CBD   Sexual activity: Yes    Partners: Male    Birth control/protection: Condom  Other Topics Concern   Not on file  Social History Narrative   Lives with family   caffeine 1-2 a day    some MAsters work in Secretary/administrator   Social Determinants  of Radio broadcast assistant Strain: Not on file  Food Insecurity: Not on file  Transportation Needs: Not on file  Physical Activity: Not on file  Stress: Not on file  Social Connections: Not on file    Family History  Problem Relation Age of Onset   Healthy Mother    Healthy Father    Headache Father    Hypertension Father    Migraines Brother    Diabetes Maternal Grandmother    Dementia Maternal Grandmother    Stroke Maternal Grandfather    Lymphoma Paternal Grandmother     Health Maintenance  Topic Date Due   HIV Screening  Never done   Hepatitis C Screening  Never done   INFLUENZA VACCINE  06/12/2021   PAP SMEAR-Modifier  04/06/2023   TETANUS/TDAP  02/20/2030   Pneumococcal Vaccine 69-83 Years old  Aged Out   HPV VACCINES  Aged Out     ----------------------------------------------------------------------------------------------------------------------------------------------------------------------------------------------------------------- Physical Exam BP (!) 153/73 (BP Location: Left Arm, Patient Position: Sitting, Cuff Size: Small)   Pulse (!) 104   Temp 97.7 F (36.5 C)   Ht '5\' 6"'  (1.676 m)   Wt 152 lb (68.9 kg)   LMP 05/22/2021 (Exact Date)   SpO2 100%   Breastfeeding  Yes   BMI 24.53 kg/m   Physical Exam Constitutional:      Appearance: Normal appearance.  HENT:     Head: Normocephalic and atraumatic.     Mouth/Throat:     Mouth: Mucous membranes are moist.  Eyes:     General: No scleral icterus. Cardiovascular:     Rate and Rhythm: Normal rate and regular rhythm.  Pulmonary:     Effort: Pulmonary effort is normal.     Breath sounds: Normal breath sounds.  Musculoskeletal:     Cervical back: Neck supple.  Skin:    General: Skin is warm and dry.     Findings: No rash.  Neurological:     General: No focal deficit present.     Mental Status: She is alert.  Psychiatric:        Mood and Affect: Mood normal.        Behavior: Behavior  normal.    ------------------------------------------------------------------------------------------------------------------------------------------------------------------------------------------------------------------- Assessment and Plan  Fatigue She has nonspecific symptoms of fatigue and body aches.  She does report remote history of tick bite.  Checking Lyme titers.  We will also check metabolic panel, CBC, ESR and repeat uric acid levels.  Vertigo She seems to be having some improvement with vestibular rehabilitation.  She will continue this for now.   No orders of the defined types were placed in this encounter.   No follow-ups on file.    This visit occurred during the SARS-CoV-2 public health emergency.  Safety protocols were in place, including screening questions prior to the visit, additional usage of staff PPE, and extensive cleaning of exam room while observing appropriate contact time as indicated for disinfecting solutions.

## 2021-05-23 NOTE — Assessment & Plan Note (Addendum)
She has nonspecific symptoms of fatigue and body aches.  She does report remote history of tick bite.  Checking Lyme titers.  We will also check metabolic panel, CBC, ESR and repeat uric acid levels.

## 2021-05-23 NOTE — Assessment & Plan Note (Signed)
She seems to be having some improvement with vestibular rehabilitation.  She will continue this for now.

## 2021-05-24 LAB — SEDIMENTATION RATE: Sed Rate: 22 mm/h — ABNORMAL HIGH (ref 0–20)

## 2021-05-24 LAB — COMPLETE METABOLIC PANEL WITH GFR
AG Ratio: 1.6 (calc) (ref 1.0–2.5)
ALT: 118 U/L — ABNORMAL HIGH (ref 6–29)
AST: 63 U/L — ABNORMAL HIGH (ref 10–30)
Albumin: 4.2 g/dL (ref 3.6–5.1)
Alkaline phosphatase (APISO): 170 U/L — ABNORMAL HIGH (ref 31–125)
BUN: 10 mg/dL (ref 7–25)
CO2: 24 mmol/L (ref 20–32)
Calcium: 9 mg/dL (ref 8.6–10.2)
Chloride: 105 mmol/L (ref 98–110)
Creat: 0.58 mg/dL (ref 0.50–0.97)
Globulin: 2.7 g/dL (calc) (ref 1.9–3.7)
Glucose, Bld: 114 mg/dL — ABNORMAL HIGH (ref 65–99)
Potassium: 3.8 mmol/L (ref 3.5–5.3)
Sodium: 138 mmol/L (ref 135–146)
Total Bilirubin: 0.7 mg/dL (ref 0.2–1.2)
Total Protein: 6.9 g/dL (ref 6.1–8.1)
eGFR: 119 mL/min/{1.73_m2} (ref >=60)

## 2021-05-24 LAB — TSH+FREE T4: TSH W/REFLEX TO FT4: 3.15 mIU/L

## 2021-05-24 LAB — CBC WITH DIFFERENTIAL/PLATELET
Absolute Monocytes: 244 cells/uL (ref 200–950)
Basophils Absolute: 30 cells/uL (ref 0–200)
Basophils Relative: 0.8 %
Eosinophils Absolute: 19 cells/uL (ref 15–500)
Eosinophils Relative: 0.5 %
HCT: 33 % — ABNORMAL LOW (ref 35.0–45.0)
Hemoglobin: 11.1 g/dL — ABNORMAL LOW (ref 11.7–15.5)
Lymphs Abs: 1835 cells/uL (ref 850–3900)
MCH: 30.3 pg (ref 27.0–33.0)
MCHC: 33.6 g/dL (ref 32.0–36.0)
MCV: 90.2 fL (ref 80.0–100.0)
MPV: 11.3 fL (ref 7.5–12.5)
Monocytes Relative: 6.6 %
Neutro Abs: 1573 cells/uL (ref 1500–7800)
Neutrophils Relative %: 42.5 %
Platelets: 139 10*3/uL — ABNORMAL LOW (ref 140–400)
RBC: 3.66 10*6/uL — ABNORMAL LOW (ref 3.80–5.10)
RDW: 13 % (ref 11.0–15.0)
Total Lymphocyte: 49.6 %
WBC: 3.7 10*3/uL — ABNORMAL LOW (ref 3.8–10.8)

## 2021-05-24 LAB — B. BURGDORFI ANTIBODIES: B burgdorferi Ab IgG+IgM: <0.9 index

## 2021-05-24 LAB — URIC ACID: Uric Acid, Serum: 5.3 mg/dL (ref 2.5–7.0)

## 2021-05-25 ENCOUNTER — Ambulatory Visit (INDEPENDENT_AMBULATORY_CARE_PROVIDER_SITE_OTHER): Payer: 59 | Admitting: Rehabilitative and Restorative Service Providers"

## 2021-05-25 ENCOUNTER — Encounter: Payer: Self-pay | Admitting: Family Medicine

## 2021-05-25 ENCOUNTER — Other Ambulatory Visit: Payer: Self-pay

## 2021-05-25 DIAGNOSIS — R2689 Other abnormalities of gait and mobility: Secondary | ICD-10-CM

## 2021-05-25 DIAGNOSIS — R42 Dizziness and giddiness: Secondary | ICD-10-CM | POA: Diagnosis not present

## 2021-05-25 DIAGNOSIS — R29818 Other symptoms and signs involving the nervous system: Secondary | ICD-10-CM

## 2021-05-25 NOTE — Patient Instructions (Signed)
Access Code: [email or web address] URL: [email or web address] Date: 05/25/2021 Prepared by: Margretta Ditty  Exercises Standing Gaze Stabilization with Head Rotation - 2 x daily - 7 x weekly - 1 sets - 2 reps - 45 seconds hold Standing Gaze Stabilization with Head Nod - 2 x daily - 7 x weekly - 1 sets - 2 reps - 45 seconds hold Single Leg Stance - 2 x daily - 7 x weekly - 1 sets - 3 reps - 10-15 seconds hold Wide Stance with Eyes Closed on Foam Pad - 2 x daily - 7 x weekly - 1 sets - 3 reps - 30 seconds hold Romberg Stance on Foam Pad with Head Rotation - 2 x daily - 7 x weekly - 1 sets - 10 reps Romberg Stance with Head Nods on Foam Pad - 2 x daily - 7 x weekly - 1 sets - 10 reps 360 Degree Turn in Both Directions - 2 x daily - 7 x weekly - 1 sets - 3 reps

## 2021-05-25 NOTE — Therapy (Signed)
[facility] [place] [address] [place] [address] [place], [place], [address] Phone: [phone]   Fax:  [phone]  Physical Therapy Treatment  Patient Details  Name: Allison Riggs MRN: [id number] Date of Birth: 03-May-1986 Referring Provider (PT): Everrett Coombe, MD   Encounter Date: 05/25/2021   PT End of Session - 05/25/21 1031     Visit Number 2    Number of Visits 6    Date for PT Re-Evaluation 06/29/21    Authorization Type [employer]    PT Start Time 1022    PT Stop Time 1100    PT Time Calculation (min) 38 min             Past Medical History:  Diagnosis Date   Anxiety    Depression    Headache    Pre-eclampsia    x 2   Pregnancy induced hypertension     Past Surgical History:  Procedure Laterality Date   CESAREAN SECTION N/A 02/19/2020   Procedure: CESAREAN SECTION;  Surgeon: Olivia Mackie, MD;  Location: [facility];  Service: Obstetrics;  Laterality: N/A;   GANGLION CYST EXCISION     WISDOM TOOTH EXTRACTION  2004    There were no vitals filed for this visit.   Subjective Assessment - 05/25/21 1023     Subjective The dizziness feels a lot better.    Patient Stated Goals Reduce dizziness.    Currently in Pain? No/denies                [facility] PT Assessment - 05/25/21 1031       Assessment   Medical Diagnosis vertigo    Referring Provider (PT) Everrett Coombe, MD    Onset Date/Surgical Date 05/07/21      Functional Gait  Assessment   Gait assessed  Yes    Gait Level Surface Walks 20 ft in less than 5.5 sec, no assistive devices, good speed, no evidence for imbalance, normal gait pattern, deviates no more than 6 in outside of the 12 in walkway width.    Change in Gait Speed Able to smoothly change walking speed without loss of balance or gait deviation. Deviate no more than 6 in outside of the 12 in walkway width.    Gait with Horizontal Head Turns Performs head turns with moderate changes in gait  velocity, slows down, deviates 10-15 in outside 12 in walkway width but recovers, can continue to walk.    Gait with Vertical Head Turns Performs task with slight change in gait velocity (eg, minor disruption to smooth gait path), deviates 6 - 10 in outside 12 in walkway width or uses assistive device    Gait and Pivot Turn Pivot turns safely within 3 sec and stops quickly with no loss of balance.    Step Over Obstacle Is able to step over 2 stacked shoe boxes taped together (9 in total height) without changing gait speed. No evidence of imbalance.    Gait with Narrow Base of Support Ambulates less than 4 steps heel to toe or cannot perform without assistance.    Gait with Eyes Closed Walks 20 ft, slow speed, abnormal gait pattern, evidence for imbalance, deviates 10-15 in outside 12 in walkway width. Requires more than 9 sec to ambulate 20 ft.    Ambulating Backwards Walks 20 ft, no assistive devices, good speed, no evidence for imbalance, normal gait    Steps Alternating feet, must use rail.    Total Score 21  FGA comment: 21/30                           [facility] Adult PT Treatment/Exercise - 05/25/21 1204       Neuro Re-ed    Neuro Re-ed Details  compliant surface standing with wide and narrow base of support with eyes open and eyes closed             Vestibular Treatment/Exercise - 05/25/21 1031       Vestibular Treatment/Exercise   Vestibular Treatment Provided Gaze;Habituation    Habituation Exercises Standing Horizontal Head Turns;Standing Vertical Head Turns;360 degree Turns    Gaze Exercises X1 Viewing Horizontal;X1 Viewing Vertical      Standing Horizontal Head Turns   Number of Reps  10    Symptom Description  on foam      Standing Vertical Head Turns   Number of Reps  10    Symptom Description  on foam      360 degree Turns   Number of Reps  3    COMMENT R and L sides      X1 Viewing Horizontal   Foot Position standing    Comments 45 seconds       X1 Viewing Vertical   Foot Position standing    Comments 45 seconds                   PT Education - 05/25/21 1058     Education Details HEP    Person(s) Educated Patient    Methods Demonstration;Explanation;Handout    Comprehension Verbalized understanding;Returned demonstration                 PT Long Term Goals - 05/18/21 1819       PT LONG TERM GOAL #1   Title The patient will be indep with HEP for gaze adaptation, habituation, and multi-sensory balance.    Time 6    Period Weeks    Target Date 06/29/21      PT LONG TERM GOAL #2   Title The patient will improve functional status score from 48% to > or equal to 59%.    Time 6    Period Weeks    Target Date 06/29/21      PT LONG TERM GOAL #3   Title The patient will improve SVA versus DVA to a < or equal to a 4 line difference demo'ing improved use of VOR.    Time 6    Period Weeks    Target Date 06/29/21      PT LONG TERM GOAL #4   Title The patient will report return to household chores without dizziness.    Time 6    Period Weeks    Target Date 06/29/21                   Plan - 05/25/21 1039     Clinical Impression Statement The patient has improved baseline dizziness today.  PT focused on adding multi-sensory balance and habituation activities to current HEP and progressed gaze adaptation.  Plan to conitnue working to [employer].    PT Treatment/Interventions ADLs/Self Care Home Management;Patient/family education;Canalith Repostioning;Vestibular;Neuromuscular re-education;Balance training;Therapeutic exercise;Gait training    PT Next Visit Plan progress gaze adaptation, assess multi-sensory balance, add HEP for balanace, begin habituation for motion sensitivity    PT Home Exercise Plan Access Code: [email or web address]    Consulted and Agree with Plan of Care Patient  Patient will benefit from skilled therapeutic intervention in order to improve the following deficits and  impairments:     Visit Diagnosis: Dizziness and giddiness  Other abnormalities of gait and mobility  Other symptoms and signs involving the nervous system     Problem List Patient Active Problem List   Diagnosis Date Noted   Fatigue 05/23/2021   Vertigo 05/11/2021   Arthralgia 03/05/2021   Seborrheic dermatitis 03/05/2021   Chronic neck pain 10/25/2020   Macromastia 10/25/2020   Postpartum care following cesarean delivery 4/9 02/22/2020   Cesarean delivery - non-reassuring fetal status and severe preeclampsia 02/22/2020   Preeclampsia in postpartum period 02/18/2020   Anxiety state 02/18/2010   MDD (major depressive disorder) 02/18/2010   STONE, URINARY CALCULUS,UNSPEC. 02/18/2010    Else Habermann, PT 05/25/2021, 12:13 PM  [facility] [address] [place] [address] [place], [place], [address] Phone: [phone]   Fax:  [phone]  Name: Allison Riggs MRN: [id number] Date of Birth: Jun 08, 1986

## 2021-05-26 ENCOUNTER — Other Ambulatory Visit: Payer: Self-pay | Admitting: Family Medicine

## 2021-05-26 DIAGNOSIS — R7989 Other specified abnormal findings of blood chemistry: Secondary | ICD-10-CM

## 2021-05-26 DIAGNOSIS — R5383 Other fatigue: Secondary | ICD-10-CM

## 2021-05-30 ENCOUNTER — Ambulatory Visit (HOSPITAL_BASED_OUTPATIENT_CLINIC_OR_DEPARTMENT_OTHER)
Admission: RE | Admit: 2021-05-30 | Discharge: 2021-05-30 | Disposition: A | Discharge status: Home / Self Care | Payer: 59 | Source: Ambulatory Visit | Attending: Family Medicine | Admitting: Family Medicine

## 2021-05-30 ENCOUNTER — Other Ambulatory Visit: Payer: Self-pay

## 2021-05-30 ENCOUNTER — Other Ambulatory Visit: Payer: 59

## 2021-05-30 DIAGNOSIS — R7989 Other specified abnormal findings of blood chemistry: Secondary | ICD-10-CM | POA: Insufficient documentation

## 2021-05-31 LAB — ANTI-NUCLEAR AB-TITER (ANA TITER): ANA Titer 1: 1:80 {titer} — ABNORMAL HIGH

## 2021-05-31 LAB — HEPATITIS PANEL, ACUTE
Hep A IgM: NONREACTIVE
Hep B C IgM: NONREACTIVE
Hepatitis B Surface Ag: NONREACTIVE
Hepatitis C Ab: NONREACTIVE
SIGNAL TO CUT-OFF: 0.01 (ref <1.00)

## 2021-05-31 LAB — IRON,TIBC AND FERRITIN PANEL
%SAT: 20 % (calc) (ref 16–45)
Ferritin: 319 ng/mL — ABNORMAL HIGH (ref 16–154)
Iron: 63 ug/dL (ref 40–190)
TIBC: 313 mcg/dL (calc) (ref 250–450)

## 2021-05-31 LAB — TEST AUTHORIZATION

## 2021-05-31 LAB — EPSTEIN-BARR VIRUS VCA ANTIBODY PANEL
EBV NA IgG: 19 U/mL — ABNORMAL HIGH
EBV VCA IgG: 346 U/mL — ABNORMAL HIGH
EBV VCA IgM: 73.1 U/mL — ABNORMAL HIGH

## 2021-05-31 LAB — ANA,IFA RA DIAG PNL W/RFLX TIT/PATN
Anti Nuclear Antibody (ANA): POSITIVE — AB
Cyclic Citrullin Peptide Ab: <16 UNITS
Rheumatoid fact SerPl-aCnc: <14 IU/mL (ref <14)

## 2021-05-31 LAB — METHYLMALONIC ACID, SERUM: Methylmalonic Acid, Quant: 72 nmol/L — ABNORMAL LOW (ref 87–318)

## 2021-06-01 ENCOUNTER — Encounter: Payer: Self-pay | Admitting: Rehabilitative and Restorative Service Providers"

## 2021-06-01 ENCOUNTER — Other Ambulatory Visit: Payer: Self-pay | Admitting: Family Medicine

## 2021-06-01 DIAGNOSIS — R768 Other specified abnormal immunological findings in serum: Secondary | ICD-10-CM

## 2021-06-01 DIAGNOSIS — M2550 Pain in unspecified joint: Secondary | ICD-10-CM

## 2021-06-02 ENCOUNTER — Encounter: Payer: 59 | Admitting: Rehabilitative and Restorative Service Providers"

## 2021-06-09 ENCOUNTER — Encounter: Payer: 59 | Admitting: Rehabilitative and Restorative Service Providers"

## 2021-06-14 ENCOUNTER — Other Ambulatory Visit: Payer: Self-pay

## 2021-06-14 ENCOUNTER — Encounter: Payer: Self-pay | Admitting: Rehabilitative and Restorative Service Providers"

## 2021-06-14 ENCOUNTER — Ambulatory Visit (INDEPENDENT_AMBULATORY_CARE_PROVIDER_SITE_OTHER): Payer: 59 | Admitting: Rehabilitative and Restorative Service Providers"

## 2021-06-14 DIAGNOSIS — R29818 Other symptoms and signs involving the nervous system: Secondary | ICD-10-CM

## 2021-06-14 DIAGNOSIS — R2689 Other abnormalities of gait and mobility: Secondary | ICD-10-CM

## 2021-06-14 DIAGNOSIS — R42 Dizziness and giddiness: Secondary | ICD-10-CM

## 2021-06-14 NOTE — Patient Instructions (Signed)
Access Code: [email or web address] URL: [email or web address] Date: 06/14/2021 Prepared by: Margretta Ditty  Program Notes WALKING PROGRAM:  3 days/week begin with a 10 minute, low intensity walk.    Exercises Standing Gaze Stabilization with Head Rotation - 2 x daily - 7 x weekly - 1 sets - 2 reps - 45 seconds hold Standing Gaze Stabilization with Head Nod - 2 x daily - 7 x weekly - 1 sets - 2 reps - 45 seconds hold Single Leg Stance - 2 x daily - 7 x weekly - 1 sets - 3 reps - 10-15 seconds hold 360 Degree Turn in Both Directions - 2 x daily - 7 x weekly - 1 sets - 3 reps Romberg Stance Eyes Closed on Foam Pad - 2 x daily - 7 x weekly - 1 sets - 3 reps - 30 seconds hold Walking with Head Rotation - 2 x daily - 7 x weekly - 1 sets - 10 reps Braided Sidestepping - 2 x daily - 7 x weekly - 1 sets - 10 reps

## 2021-06-14 NOTE — Therapy (Signed)
[facility] [address] [place], [place], [address] Phone: [phone]   Fax:  [phone]  Physical Therapy Treatment  Patient Details  Name: Allison Riggs MRN: [id number] Date of Birth: Mar 24, 1986 Referring Provider (PT): Everrett Coombe, MD   Encounter Date: 06/14/2021   PT End of Session - 06/14/21 0853     Visit Number 3    Number of Visits 6    Date for PT Re-Evaluation 06/29/21    Authorization Type [employer]    PT Start Time 0851    PT Stop Time 0929    PT Time Calculation (min) 38 min    Activity Tolerance Patient tolerated treatment well    Behavior During Therapy [facility] for tasks assessed/performed             Past Medical History:  Diagnosis Date   Anxiety    Depression    Headache    Pre-eclampsia    x 2   Pregnancy induced hypertension     Past Surgical History:  Procedure Laterality Date   CESAREAN SECTION N/A 02/19/2020   Procedure: CESAREAN SECTION;  Surgeon: Olivia Mackie, MD;  Location: [facility] LD ORS;  Service: Obstetrics;  Laterality: N/A;   GANGLION CYST EXCISION     WISDOM TOOTH EXTRACTION  2004    There were no vitals filed for this visit.   Subjective Assessment - 06/14/21 0851     Subjective The patient is going to see a rheumatologist soon due to joint pain and fatigue.  Dizziness is a lot better day to day.  She still notes an off balance sensation during daily tasks where she turns quickly or moves quickly to get into/out of bed.    Patient Stated Goals Reduce dizziness.    Currently in Pain? No/denies                [facility] PT Assessment - 06/14/21 0916       Assessment   Medical Diagnosis vertigo    Referring Provider (PT) Everrett Coombe, MD    Onset Date/Surgical Date 05/07/21      Functional Gait  Assessment   Gait assessed  Yes    Gait Level Surface Walks 20 ft in less than 5.5 sec, no assistive devices, good speed, no evidence for imbalance, normal gait  pattern, deviates no more than 6 in outside of the 12 in walkway width.    Change in Gait Speed Able to smoothly change walking speed without loss of balance or gait deviation. Deviate no more than 6 in outside of the 12 in walkway width.    Gait with Horizontal Head Turns Performs head turns with moderate changes in gait velocity, slows down, deviates 10-15 in outside 12 in walkway width but recovers, can continue to walk.    Gait with Vertical Head Turns Performs task with moderate change in gait velocity, slows down, deviates 10-15 in outside 12 in walkway width but recovers, can continue to walk.    Gait and Pivot Turn Pivot turns safely within 3 sec and stops quickly with no loss of balance.    Step Over Obstacle Is able to step over 2 stacked shoe boxes taped together (9 in total height) without changing gait speed. No evidence of imbalance.    Gait with Narrow Base of Support Is able to ambulate for 10 steps heel to toe with no staggering.    Gait with Eyes Closed Walks 20 ft, uses assistive device, slower speed, mild gait  deviations, deviates 6-10 in outside 12 in walkway width. Ambulates 20 ft in less than 9 sec but greater than 7 sec.    Ambulating Backwards Walks 20 ft, uses assistive device, slower speed, mild gait deviations, deviates 6-10 in outside 12 in walkway width.    Steps Alternating feet, must use rail.    Total Score 23    FGA comment: 23/30                           [facility] Adult PT Treatment/Exercise - 06/14/21 1305       Ambulation/Gait   Ambulation/Gait Yes    Gait Comments gait with ball toss, gait with horizontal head turns, gait with vertical head turns      Neuro Re-ed    Neuro Re-ed Details  standing on foam with eyes closed with narrowing base of support; single leg stance x 10 seconds requiring multiple attempts to hold >10 seconds; sidestepping and braiding             Vestibular Treatment/Exercise - 06/14/21 0856       Vestibular  Treatment/Exercise   Vestibular Treatment Provided Habituation;Gaze    Habituation Exercises Standing Diagonal Head Turns      Standing Horizontal Head Turns   Symptom Description  with gait, added ball toss with walking for more dynamic activities      Standing Diagonal Head Turns   Number of Reps  5    Symptiom Description  standing reach to shin and contralateral side overhead towards cabinet      360 degree Turns   Number of Reps  3    COMMENT to L rates dizziness 3/10; to R rates dizziness 4/10      X1 Viewing Horizontal   Foot Position standing    Comments discussed eyewear as this feels harder today than at home-- patient uses eyeglasses at home and is using contacts today      X1 Viewing Vertical   Foot Position standing    Comments 45 seconds                   PT Education - 06/14/21 1303     Education Details progression of HEP    Person(s) Educated Patient    Methods Explanation;Demonstration;Handout    Comprehension Verbalized understanding;Returned demonstration                 PT Long Term Goals - 05/18/21 1819       PT LONG TERM GOAL #1   Title The patient will be indep with HEP for gaze adaptation, habituation, and multi-sensory balance.    Time 6    Period Weeks    Target Date 06/29/21      PT LONG TERM GOAL #2   Title The patient will improve functional status score from 48% to > or equal to 59%.    Time 6    Period Weeks    Target Date 06/29/21      PT LONG TERM GOAL #3   Title The patient will improve SVA versus DVA to a < or equal to a 4 line difference demo'ing improved use of VOR.    Time 6    Period Weeks    Target Date 06/29/21      PT LONG TERM GOAL #4   Title The patient will report return to household chores without dizziness.    Time 6    Period Weeks  Target Date 06/29/21                   Plan - 06/14/21 1303     Clinical Impression Statement The patient continues to improve in dizziness.  She  is able to tolerate more challenging HEP with PT adding dynamic gait tasks.  She has improved to 23/30 on FGA.  She is going to work on [employer] and f/u with PT in 3 weeks.    PT Treatment/Interventions ADLs/Self Care Home Management;Patient/family education;Canalith Repostioning;Vestibular;Neuromuscular re-education;Balance training;Therapeutic exercise;Gait training    PT Next Visit Plan progress gaze, check HEP, work on motion sensitivity/balance    PT Home Exercise Plan Access Code: [email or web address]    Consulted and Agree with Plan of Care Patient             Patient will benefit from skilled therapeutic intervention in order to improve the following deficits and impairments:     Visit Diagnosis: Dizziness and giddiness  Other abnormalities of gait and mobility  Other symptoms and signs involving the nervous system     Problem List Patient Active Problem List   Diagnosis Date Noted   Fatigue 05/23/2021   Vertigo 05/11/2021   Arthralgia 03/05/2021   Seborrheic dermatitis 03/05/2021   Chronic neck pain 10/25/2020   Macromastia 10/25/2020   Postpartum care following cesarean delivery 4/9 02/22/2020   Cesarean delivery - non-reassuring fetal status and severe preeclampsia 02/22/2020   Preeclampsia in postpartum period 02/18/2020   Anxiety state 02/18/2010   MDD (major depressive disorder) 02/18/2010   STONE, URINARY CALCULUS,UNSPEC. 02/18/2010   Margretta Ditty, MPT   Julez Huseby 06/14/2021, 1:06 PM  [facility] [address] [place], [place], [address] Phone: [phone]   Fax:  [phone]  Name: Allison Riggs MRN: [id number] Date of Birth: 1986-10-16

## 2021-06-29 NOTE — Progress Notes (Signed)
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 )   History of Present Illness: Allison Riggs is a 35 y.o. female here for positive ANA with multiple symptoms including fatigue, joint pains, and vertigo. Lab testing in PCP clinic was consistent with recent EBV infection also demonstrating mild thrombocytopenia and transaminitis.  She has been feeling fairly generalized symptoms for at least the past year with arthralgias but has been overall in worse condition since suffering mono earlier this year.  He feels some chronic joint pains usually without any swelling or redness.  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  She denies any new skin rashes, oral ulcers, lymphadenopathy, Raynaud's, or history of blood clots.  She has required C-section due to pregnancy complication and preeclampsia with liver function abnormalities.  Laboratory testing indicated low positive ANA titer also mild thrombocytopenia and liver dysfunction though findings may have been affected by the mono at that time.  Labs reviewed 05/2021 ANA 1:80 homogenous RF neg CRP neg ESR 22 EBV VCA IgM+/IgG+ CBC Plts 139 CMP Alkphos 170 AST 63 ALT 118 MMA 72 (87-318) Lyme neg Ferritin 319 Uric acid 5.3 TSH wnl HAV/HBV/HCV neg   Activities of Daily Living:  Patient reports morning stiffness for several hours.   Patient Reports nocturnal pain.  Difficulty dressing/grooming: Denies Difficulty climbing stairs: Denies Difficulty getting out of chair: Denies Difficulty using hands for taps, buttons, cutlery, and/or writing:  Reports  Review of Systems  Constitutional:  Positive for fatigue.  HENT:  Positive for mouth dryness. Negative for mouth sores and nose dryness.   Eyes:  Negative for pain, itching, visual disturbance and dryness.  Respiratory:  Negative for cough, hemoptysis, shortness of breath and difficulty breathing.   Cardiovascular:  Negative for chest pain, palpitations and swelling in legs/feet.  Gastrointestinal:  Positive for abdominal pain and constipation. Negative for blood in stool and diarrhea.  Endocrine: Negative for increased urination.  Genitourinary:  Negative for painful urination.  Musculoskeletal:  Positive for joint pain, joint pain, myalgias, muscle weakness, morning stiffness, muscle tenderness and myalgias. Negative for joint swelling.  Skin:  Negative for color change, rash and redness.  Allergic/Immunologic: Positive for susceptible to infections.  Neurological:  Positive for dizziness, headaches, memory loss and weakness. Negative for numbness.  Hematological:  Negative for swollen glands.  Psychiatric/Behavioral:  Positive for confusion. Negative for sleep disturbance.    PMFS History:  Patient Active Problem List   Diagnosis Date Noted   Positive ANA (antinuclear antibody) 06/30/2021   Fatigue 05/23/2021   Vertigo 05/11/2021   Arthralgia 03/05/2021   Seborrheic dermatitis 03/05/2021   Chronic neck pain 10/25/2020   Macromastia 10/25/2020   Postpartum care following cesarean delivery 4/9 02/22/2020   Cesarean delivery - non-reassuring fetal status and severe preeclampsia 02/22/2020   Preeclampsia in postpartum period 02/18/2020   Anxiety state 02/18/2010   MDD (major depressive disorder) 02/18/2010   STONE, URINARY CALCULUS,UNSPEC. 02/18/2010    Past Medical History:  Diagnosis Date   Anxiety    Depression  Headache    Pre-eclampsia    x 2   Pregnancy induced hypertension     Family History  Problem Relation Age of Onset   Healthy Mother    Headache  Father    Hypertension Father    Migraines Brother    Healthy Brother    Diabetes Maternal Grandmother    Dementia Maternal Grandmother    Stroke Maternal Grandfather    Lymphoma Paternal Grandmother    Healthy Son    Healthy Son    Healthy Son    Healthy Daughter    Past Surgical History:  Procedure Laterality Date   CESAREAN SECTION N/A 02/19/2020   Procedure: CESAREAN SECTION;  Surgeon: Brien Few, MD;  Location: [facility] LD ORS;  Service: Obstetrics;  Laterality: N/A;   GANGLION CYST EXCISION     WISDOM TOOTH EXTRACTION  2004   Social History   Social History Narrative   Lives with family   caffeine 1-2 a day    some MAsters work in [employer] History  Administered Date(s) Administered   Tdap 12/13/2017, 02/21/2020     Objective: Vital Signs: BP (!) 134/97 (BP Location: Right Arm, Patient Position: Sitting, Cuff Size: Normal)   Pulse 91   Ht '5\' 6"'  (1.676 m)   Wt 149 lb (67.6 kg)   Breastfeeding Yes   BMI 24.05 kg/m    Physical Exam Eyes:     Conjunctiva/sclera: Conjunctivae normal.  Cardiovascular:     Rate and Rhythm: Normal rate and regular rhythm.  Pulmonary:     Effort: Pulmonary effort is normal.     Breath sounds: Normal breath sounds.  Skin:    General: Skin is warm and dry.     Findings: No rash.  Neurological:     General: No focal deficit present.     Mental Status: She is alert.     Deep Tendon Reflexes: Reflexes normal.  Psychiatric:        Mood and Affect: Mood normal.     Musculoskeletal Exam:  Neck full ROM no tenderness Shoulders full ROM no tenderness or swelling, tenderness over upper back muscles  Elbows full ROM no tenderness or swelling Wrists full ROM no tenderness or swelling Fingers full ROM no tenderness or swelling Knees full ROM no tenderness or swelling Ankles full ROM no tenderness or swelling MTPs full ROM no tenderness or swelling   Investigation: No additional findings.  Imaging: No results  found.  Recent Labs: Lab Results  Component Value Date   WBC 3.7 (L) 05/23/2021   HGB 11.1 (L) 05/23/2021   PLT 139 (L) 05/23/2021   NA 138 05/23/2021   K 3.8 05/23/2021   CL 105 05/23/2021   CO2 24 05/23/2021   GLUCOSE 114 (H) 05/23/2021   BUN 10 05/23/2021   CREATININE 0.58 05/23/2021   BILITOT 0.7 05/23/2021   ALKPHOS 63 02/22/2020   AST 63 (H) 05/23/2021   ALT 118 (H) 05/23/2021   PROT 6.9 05/23/2021   ALBUMIN 2.1 (L) 02/22/2020   CALCIUM 9.0 05/23/2021   GFRAA >60 02/22/2020    Speciality Comments: No specialty comments available.  Procedures:  No procedures performed Allergies: Patient has no known allergies.   Assessment / Plan:     Visit Diagnoses: Positive ANA (antinuclear antibody)  Arthralgia, unspecified joint - Plan: RNP Antibody, Anti-Smith antibody, Sjogrens syndrome-A extractable nuclear antibody, Anti-DNA antibody, double-stranded, C3 and C4  Positive ANA serology although no specific clinical features present on exam today.  We  will check ENA antibody markers bone have lower pretest suspicion of active immune disease.  Fatigue  Decreased energy and exercise tolerance is a does not describe any sleep pattern very suggestive for apnea at this time.  She takes the Wellbutrin and feels this helps her daytime alertness is wondering if alternative medications would also be beneficial.  At this time I am not sure the underlying cause and no specific new treatments were recommended.  Cesarean delivery - non-reassuring fetal status and severe preeclampsia  History of preeclampsia and pregnancy complications risks increasing underlying connective tissue disease testing antibodies as above  Orders: Orders Placed This Encounter  Procedures   RNP Antibody   Anti-Smith antibody   Sjogrens syndrome-A extractable nuclear antibody   Anti-DNA antibody, double-stranded   C3 and C4    No orders of the defined types were placed in this encounter.    Follow-Up  Instructions: No follow-ups on file.   Collier Salina, MD  Note - This record has been created using [employer].  Chart creation errors have been sought, but may not always  have been located. Such creation errors do not reflect on  the standard of medical care.

## 2021-06-30 ENCOUNTER — Encounter: Payer: Self-pay | Admitting: Internal Medicine

## 2021-06-30 ENCOUNTER — Ambulatory Visit (INDEPENDENT_AMBULATORY_CARE_PROVIDER_SITE_OTHER): Payer: 59 | Admitting: Internal Medicine

## 2021-06-30 ENCOUNTER — Other Ambulatory Visit: Payer: Self-pay

## 2021-06-30 VITALS — BP 134/97 | HR 91 | Ht 66.0 in | Wt 149.0 lb

## 2021-06-30 DIAGNOSIS — M2550 Pain in unspecified joint: Secondary | ICD-10-CM

## 2021-06-30 DIAGNOSIS — R768 Other specified abnormal immunological findings in serum: Secondary | ICD-10-CM | POA: Insufficient documentation

## 2021-06-30 DIAGNOSIS — R5383 Other fatigue: Secondary | ICD-10-CM | POA: Diagnosis not present

## 2021-06-30 NOTE — Patient Instructions (Signed)
I am checking several more specific antibody markers for autoimmune conditions such as lupus.  Anti-DNA Antibody Test Why am I having this test? The anti-DNA antibody test helps with the diagnosis and follow-up of systemic lupus erythematosus (SLE). It is also used to monitor treatment of thiscondition as the antibody decreases with successful therapy. What is being tested? This test measures the amount of anti-DNA antibody in the blood. This antibody is found in 65-80% of patients with active SLE. This antibody is not as commonin patients who have other diseases. What kind of sample is taken?  A blood sample is required for this test. It is usually collected by insertinga needle into a blood vessel. How are the results reported? Your test results will be reported as a value. Your test results may also be reported as positive, intermediate, or negative. Your health care provider will compare your results to normal ranges that were established after testing a large group of people (reference values). Reference values may vary among labs and hospitals. For this test, common reference values are: Positive: 10 or more international units/mL. Intermediate: 5-9 international units/mL. Negative: Less than 5 international units/mL. What do the results mean? Positive results, which are associated with results that are higher than the reference values, may indicate: Autoimmune disorders such as SLE. Infectious mononucleosis. Chronic liver conditions. Intermediate results mean that the anti-DNA antibody levels are higher thannormal, but not high enough to be considered positive. Negative results mean that you do not have the anti-DNA antibody that isassociated with these conditions. Talk with your health care provider about what your results mean. Questions to ask your health care provider Ask your health care provider, or the department that is doing the test: When will my results be ready? How will  I get my results? What are my treatment options? What other tests do I need? What are my next steps? Summary The anti-DNA antibody test helps with the diagnosis and follow-up of systemic lupus erythematosus (SLE). It is also used to monitor treatment of this condition as the antibody decreases with successful therapy. This test measures the amount of anti-DNA antibody in the blood. Elevated levels of anti-DNA antibody can be seen in patients with SLE and certain other conditions.  Complement Assay Test Why am I having this test? Complement refers to a group of proteins that are part of the body's disease-fighting system (immune system). A complement assay test provides information about some or all of these proteins. You may have this test: To diagnose a lack, or deficiency, of certain complement proteins. Deficiencies can be passed from parent to child (inherited). To monitor an infection or autoimmune disease. If you have unexplained inflammation or swelling (edema). If you have bacterial infections again and again. What is being tested? This test can be used to measure: Total complement. This is the total number of protein complements in your blood. The number of each kind of complement in your blood. The nine main kinds of complement are labeled C1 through C9. Some of these complements, such as C3 and C4, are especially important and have many functions in the body. Depending on why you are having the test, your health care provider may test your total complement or only some individual complements, such as C3 and C4. The total complement assay test may be done before individual complements aretested. What kind of sample is taken?  A blood sample is required for this test. It is usually collected by insertinga needle into a blood vessel.  Tell a health care provider about: Any allergies you have. All medicines you are taking, including vitamins, herbs, eye drops, creams, and  over-the-counter medicines. Any blood disorders you have. Any surgeries you have had. Any medical conditions you have. Whether you are pregnant or may be pregnant. How are the results reported? Your results will be reported as a value that tells you how much complement is in your blood. This will be given as units per milliliter of blood (units/mL) or as milligrams per deciliter of blood (mg/dL). Your results may be reportedas total complement, or as individual complements, or both. Your health care provider will compare your results to normal ranges that were established after testing a large group of people (reference ranges). Reference ranges may vary among labs and hospitals. For this test, reference ranges for some of the most commonly measured complement assays may be: Total complement: 30-75 units/mL. C2: 1-4 mg/dL. C3: 75-175 mg/dL. C4: 22-45 units/mL. What do the results mean? Results within reference ranges are considered normal, which means you have anormal amount of complement in your blood. Results that are higher than the reference ranges may be caused by: Inflammatory disease. Heart attack. Cancer. Complement deficiencies, or results lower than the reference ranges, may be caused by: Certain inherited conditions. Autoimmune disease. Certain liver diseases. Malnutrition. Certain types of anemia that result in breakdown of red blood cells (hemolytic anemia). Talk with your health care provider about what your results mean. Questions to ask your health care provider Ask your health care provider, or the department that is doing the test: When will my results be ready? How will I get my results? What are my treatment options? What other tests do I need? What are my next steps? Summary Complement refers to a group of proteins that are part of the body's disease-fighting system (immune system). A complement assay test can provide information about some or all of these  proteins. You may have a complement assay test to help diagnose a complement deficiency, and to monitor some infections or autoimmune disease. Talk with your health care provider about what your results mean. This information is not intended to replace advice given to you by your health care provider. Make sure you discuss any questions you have with your healthcare provider. Document Revised: 08/25/2020 Document Reviewed: 08/25/2020 Elsevier Patient Education  2022 [employer].

## 2021-07-03 LAB — ANTI-DNA ANTIBODY, DOUBLE-STRANDED: ds DNA Ab: <1 IU/mL

## 2021-07-03 LAB — C3 AND C4
C3 Complement: 150 mg/dL (ref 83–193)
C4 Complement: 44 mg/dL (ref 15–57)

## 2021-07-03 LAB — SJOGRENS SYNDROME-A EXTRACTABLE NUCLEAR ANTIBODY: SSA (Ro) (ENA) Antibody, IgG: <1 AI

## 2021-07-03 LAB — ANTI-SMITH ANTIBODY: ENA SM Ab Ser-aCnc: <1 AI

## 2021-07-03 LAB — RNP ANTIBODY: Ribonucleic Protein(ENA) Antibody, IgG: <1 AI

## 2021-07-05 ENCOUNTER — Other Ambulatory Visit: Payer: Self-pay

## 2021-07-05 ENCOUNTER — Ambulatory Visit (INDEPENDENT_AMBULATORY_CARE_PROVIDER_SITE_OTHER): Payer: 59 | Admitting: Rehabilitative and Restorative Service Providers"

## 2021-07-05 DIAGNOSIS — R29818 Other symptoms and signs involving the nervous system: Secondary | ICD-10-CM | POA: Diagnosis not present

## 2021-07-05 DIAGNOSIS — R42 Dizziness and giddiness: Secondary | ICD-10-CM | POA: Diagnosis not present

## 2021-07-05 DIAGNOSIS — R2689 Other abnormalities of gait and mobility: Secondary | ICD-10-CM | POA: Diagnosis not present

## 2021-07-05 NOTE — Therapy (Addendum)
[facility] [address] [place], [place], [address] Phone: [phone]   Fax:  [phone]  Physical Therapy Treatment and Renewal Summary/ Discharge  Patient Details  Name: Allison Riggs MRN: [id number] Date of Birth: Mar 18, 1986 Referring Provider (PT): Luetta Nutting, MD   Encounter Date: 07/05/2021   PT End of Session - 07/05/21 1025     Visit Number 4    Number of Visits 6    Date for PT Re-Evaluation 06/29/21    Authorization Type [employer]    PT Start Time 1020    PT Stop Time 1100    PT Time Calculation (min) 40 min    Activity Tolerance Patient tolerated treatment well    Behavior During Therapy [facility] for tasks assessed/performed             Past Medical History:  Diagnosis Date   Anxiety    Depression    Headache    Pre-eclampsia    x 2   Pregnancy induced hypertension     Past Surgical History:  Procedure Laterality Date   CESAREAN SECTION N/A 02/19/2020   Procedure: CESAREAN SECTION;  Surgeon: Brien Few, MD;  Location: [facility];  Service: Obstetrics;  Laterality: N/A;   GANGLION CYST EXCISION     WISDOM TOOTH EXTRACTION  2004    There were no vitals filed for this visit.   Subjective Assessment - 07/05/21 1023     Subjective The patient reports dizziness has beena little bit more lately.  It's not all of the time, but she notes it with eyes closed or quick head motions. "It's still not as bad as it was."  She reports a lot of things are better, and she has returned to doing tasks at home.  She is struggling with heavier projects of stepping over items when cleaning out rooms/closets. She is trying to work her exercises into daily activities.  She has not been doing gaze exercises, but has returned to driving.    Patient Stated Goals Reduce dizziness.    Currently in Pain? No/denies                [facility] PT Assessment - 07/05/21 1028       Assessment   Medical Diagnosis  vertigo    Referring Provider (PT) Luetta Nutting, MD    Onset Date/Surgical Date 05/07/21      Functional Gait  Assessment   Gait assessed  Yes    Gait Level Surface Walks 20 ft in less than 5.5 sec, no assistive devices, good speed, no evidence for imbalance, normal gait pattern, deviates no more than 6 in outside of the 12 in walkway width.    Change in Gait Speed Able to smoothly change walking speed without loss of balance or gait deviation. Deviate no more than 6 in outside of the 12 in walkway width.    Gait with Horizontal Head Turns Performs head turns smoothly with slight change in gait velocity (eg, minor disruption to smooth gait path), deviates 6-10 in outside 12 in walkway width, or uses an assistive device.    Gait with Vertical Head Turns Performs head turns with no change in gait. Deviates no more than 6 in outside 12 in walkway width.    Gait and Pivot Turn Pivot turns safely within 3 sec and stops quickly with no loss of balance.    Step Over Obstacle Is able to step over 2 stacked shoe boxes taped together (9 in  total height) without changing gait speed. No evidence of imbalance.    Gait with Narrow Base of Support Is able to ambulate for 10 steps heel to toe with no staggering.    Gait with Eyes Closed Walks 20 ft, uses assistive device, slower speed, mild gait deviations, deviates 6-10 in outside 12 in walkway width. Ambulates 20 ft in less than 9 sec but greater than 7 sec.    Ambulating Backwards Walks 20 ft, uses assistive device, slower speed, mild gait deviations, deviates 6-10 in outside 12 in walkway width.    Steps Alternating feet, no rail.    Total Score 27    FGA comment: 27/30                 Vestibular Assessment - 07/05/21 1028       Visual Acuity   Static line 8   wearing contacts   Dynamic line 1   *Notes has not been doing gaze exercises                     [facility] Adult PT Treatment/Exercise - 07/05/21 1259       Neuro Re-ed     Neuro Re-ed Details  braiding, single leg stance, head turns with gait activities             Vestibular Treatment/Exercise - 07/05/21 1035       Vestibular Treatment/Exercise   Vestibular Treatment Provided Habituation;Gaze    Habituation Exercises Standing Horizontal Head Turns      Standing Horizontal Head Turns   Number of Reps  5    Symptom Description  with gait in HEP, provokes mild dizziness      X1 Viewing Horizontal   Foot Position standing    Comments 30-45 seconds      X1 Viewing Vertical   Foot Position standing    Comments 45 seconds                   PT Education - 07/05/21 1253     Education Details HEP    Person(s) Educated Patient    Methods Explanation;Demonstration;Handout    Comprehension Verbalized understanding;Returned demonstration                 PT Long Term Goals - 07/05/21 1033       PT LONG TERM GOAL #1   Title The patient will be indep with HEP for gaze adaptation, habituation, and multi-sensory balance.    Time 6    Period Weeks    Status Achieved      PT LONG TERM GOAL #2   Title The patient will improve functional status score from 48% to > or equal to 59%.    Baseline improved to 54%    Time 6    Period Weeks    Status Partially Met      PT LONG TERM GOAL #3   Title The patient will improve SVA versus DVA to a < or equal to a 4 line difference demo'ing improved use of VOR.    Time 6    Period Weeks    Status Not Met      PT LONG TERM GOAL #4   Title The patient will report return to household chores without dizziness.    Baseline heavy chores still increase symptoms    Time 6    Period Weeks    Status Achieved  UPDATED LONG TERM GOALS:  PT Long Term Goals - 07/05/21 1303       PT LONG TERM GOAL #1   Title The patient will be indep with progression of HEP.    Time 6    Period Weeks    Status Revised    Target Date 08/16/21      PT LONG TERM GOAL #2   Title The patient  will improve functional status score from 48% to > or equal to 59%.    Baseline improved to 54%    Time 6    Period Weeks    Status Revised    Target Date 08/16/21      PT LONG TERM GOAL #3   Title The patient will improve SVA versus DVA to a < or equal to a 4 line difference demo'ing improved use of VOR.    Time 6    Period Weeks    Status Revised    Target Date 08/16/21                 Plan - 07/05/21 1253     Clinical Impression Statement The patient has partially met LTGs.  She has not made improvement in static versus dynamic visual acuity, however, she also notes she has not been performing regular gaze adaptation home exercises.  Barriers to participating in HEP are her memory, time due to parenting demands.  PT discussed using phone to set daily reminder and recommended focusing on gaze adaptation x 1 in horiz/vertical plane.  We consolidated other HEP to focus on these as primary focus.  The patient has functionally improved noting return to driving, return to daily household tasks.  She continues with difficulty performing heavier household tasks.  Overall, patient notes progress.  PT emphasized greater compliance with HEP to make gains in gaze adaptation.    PT Treatment/Interventions ADLs/Self Care Home Management;Patient/family education;Canalith Repostioning;Vestibular;Neuromuscular re-education;Balance training;Therapeutic exercise;Gait training    PT Next Visit Plan progress gaze, check HEP, work on motion sensitivity/balance    PT Home Exercise Plan Access Code: [email or web address]    Consulted and Agree with Plan of Care Patient             Patient will benefit from skilled therapeutic intervention in order to improve the following deficits and impairments:     Visit Diagnosis: Dizziness and giddiness  Other abnormalities of gait and mobility  Other symptoms and signs involving the nervous system     Problem List Patient Active Problem List   Diagnosis Date  Noted   Positive ANA (antinuclear antibody) 06/30/2021   Fatigue 05/23/2021   Vertigo 05/11/2021   Arthralgia 03/05/2021   Seborrheic dermatitis 03/05/2021   Chronic neck pain 10/25/2020   Macromastia 10/25/2020   Postpartum care following cesarean delivery 4/9 02/22/2020   Cesarean delivery - non-reassuring fetal status and severe preeclampsia 02/22/2020   Preeclampsia in postpartum period 02/18/2020   Anxiety state 02/18/2010   MDD (major depressive disorder) 02/18/2010   STONE, URINARY CALCULUS,UNSPEC. 02/18/2010    PHYSICAL THERAPY DISCHARGE SUMMARY  Visits from Start of Care: 4  Current functional level related to goals / functional outcomes: See goals above from renewal   Remaining deficits: See above for current patient status--did not return for remaining visit   Education / Equipment: HEP   Patient agrees to discharge. Patient goals were partially met. Patient is being discharged due to not returning since the last visit.  Rudell Cobb, MPT  [place] 07/05/2021, 1:02 PM  [facility] [place] [place] [place], [place], [address] Phone: [phone]   Fax:  [phone]  Name: Jazmynn Pho MRN: [id number] Date of Birth: 05/02/1986

## 2021-07-05 NOTE — Patient Instructions (Signed)
Access Code: [email or web address] URL: [email or web address] Date: 07/05/2021 Prepared by: Margretta Ditty  Program Notes WALKING PROGRAM:  3 days/week begin with a 10 minute, low intensity walk.    Exercises Standing Gaze Stabilization with Head Rotation - 2 x daily - 7 x weekly - 1 sets - 2 reps - 45-60 seconds hold Standing Gaze Stabilization with Head Nod - 2 x daily - 7 x weekly - 1 sets - 2 reps - 45-60 seconds hold Single Leg Stance - 2 x daily - 7 x weekly - 1 sets - 3 reps - 10-15 seconds hold 360 Degree Turn in Both Directions - 2 x daily - 7 x weekly - 1 sets - 3 reps Walking with Head Rotation - 2 x daily - 7 x weekly - 1 sets - 10 reps

## 2022-03-08 DIAGNOSIS — Z6823 Body mass index (BMI) 23.0-23.9, adult: Secondary | ICD-10-CM | POA: Diagnosis not present

## 2022-03-08 DIAGNOSIS — R87610 Atypical squamous cells of undetermined significance on cytologic smear of cervix (ASC-US): Secondary | ICD-10-CM | POA: Diagnosis not present

## 2022-03-08 DIAGNOSIS — Z01419 Encounter for gynecological examination (general) (routine) without abnormal findings: Secondary | ICD-10-CM | POA: Diagnosis not present

## 2022-06-26 DIAGNOSIS — R69 Illness, unspecified: Secondary | ICD-10-CM | POA: Diagnosis not present

## 2022-09-08 DIAGNOSIS — R69 Illness, unspecified: Secondary | ICD-10-CM | POA: Diagnosis not present

## 2022-12-11 DIAGNOSIS — N62 Hypertrophy of breast: Secondary | ICD-10-CM | POA: Diagnosis not present

## 2023-06-21 DIAGNOSIS — Z01419 Encounter for gynecological examination (general) (routine) without abnormal findings: Secondary | ICD-10-CM | POA: Diagnosis not present

## 2023-06-21 DIAGNOSIS — Z124 Encounter for screening for malignant neoplasm of cervix: Secondary | ICD-10-CM | POA: Diagnosis not present

## 2023-06-21 DIAGNOSIS — R300 Dysuria: Secondary | ICD-10-CM | POA: Diagnosis not present

## 2023-06-21 DIAGNOSIS — Z01411 Encounter for gynecological examination (general) (routine) with abnormal findings: Secondary | ICD-10-CM | POA: Diagnosis not present

## 2023-06-21 DIAGNOSIS — Z113 Encounter for screening for infections with a predominantly sexual mode of transmission: Secondary | ICD-10-CM | POA: Diagnosis not present

## 2023-06-26 IMAGING — CT CT HEAD W/O CM
3 series · 16 of 47 positions shown, 19 images · non-contrast
Comparison: None.

CLINICAL DATA: Dizziness

EXAM:
CT HEAD WITHOUT CONTRAST
TECHNIQUE: Contiguous axial images were obtained from the base of the skull
through the vertex without intravenous contrast.

[Series 2: head wo · axial · 0.45mm/px · z∈[+848,+988]mm · 10 of 34 slices shown, 13 images]
[im 3/34  brain]
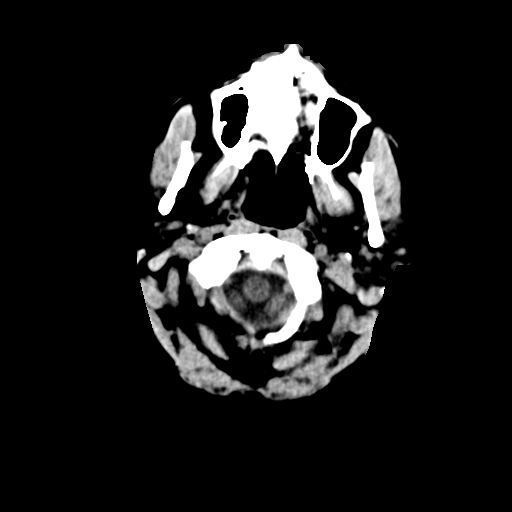
[im 3/34  bone]
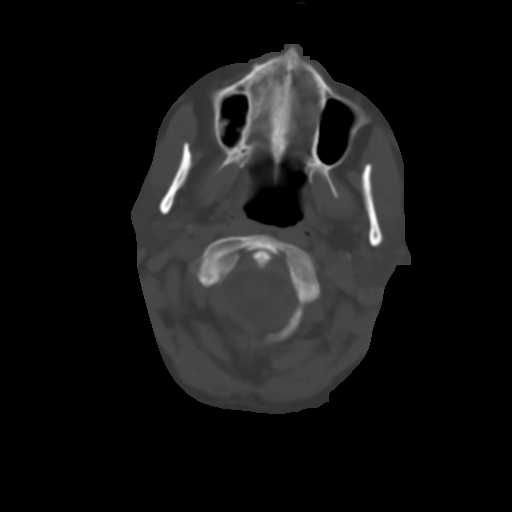
[im 6/34  brain]
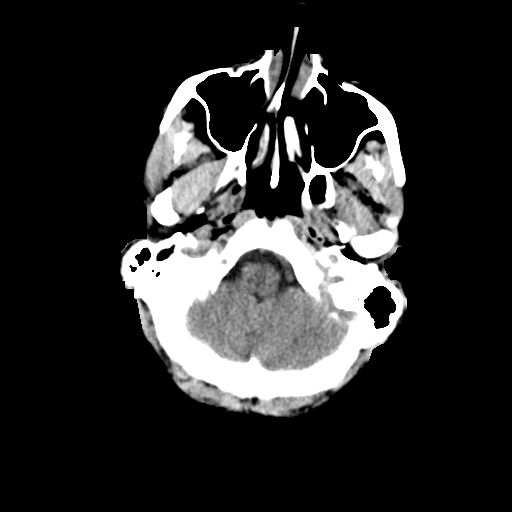
[im 10/34  brain]
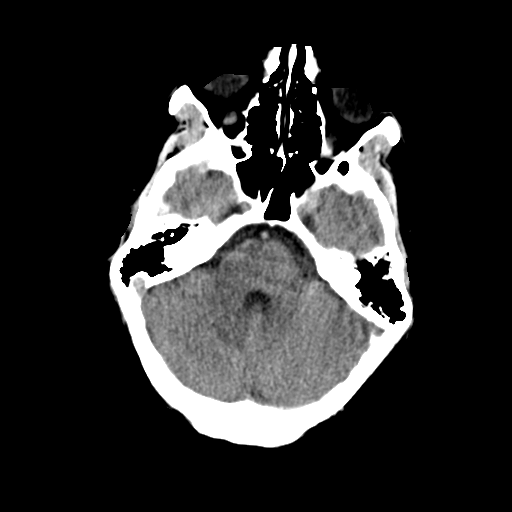
[im 12/34  brain]
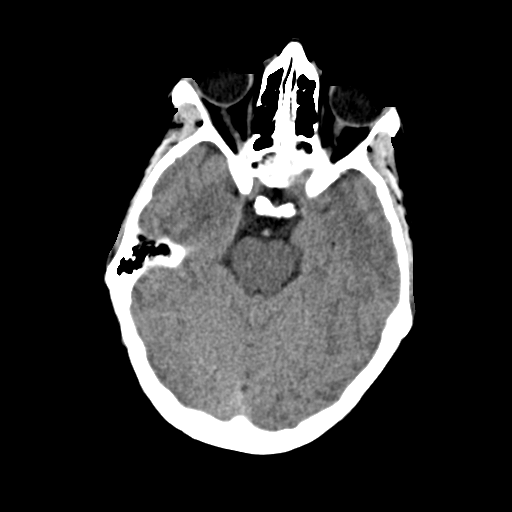
[im 15/34  brain]
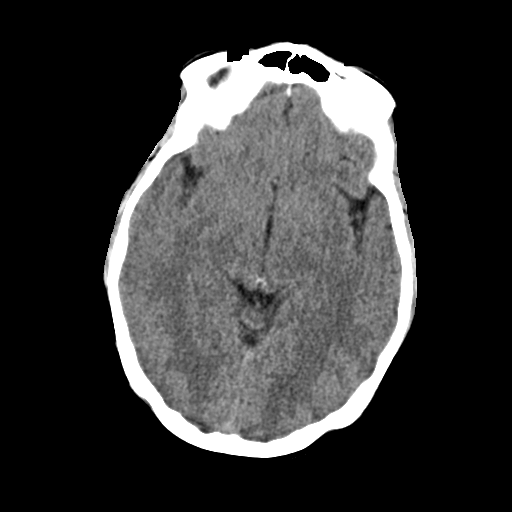
[im 15/34  bone]
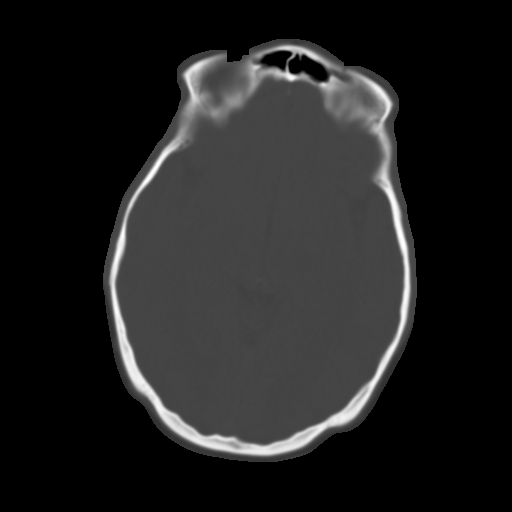
[im 19/34  brain]
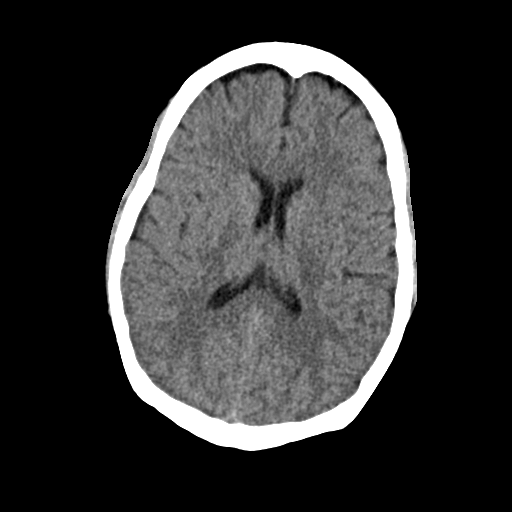
[im 22/34  brain]
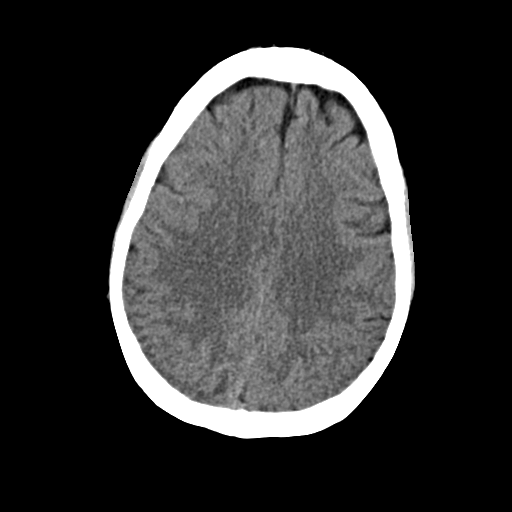
[im 26/34  brain]
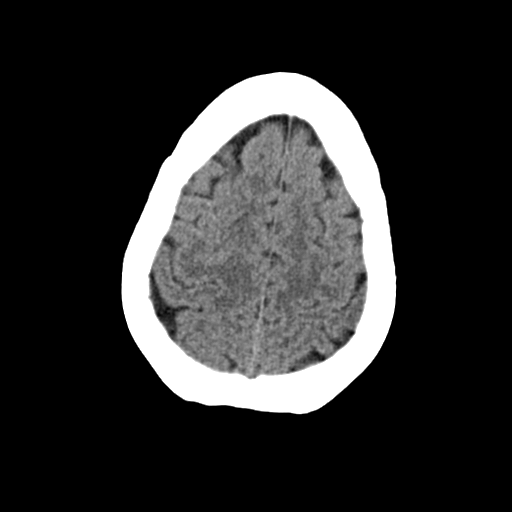
[im 28/34  brain]
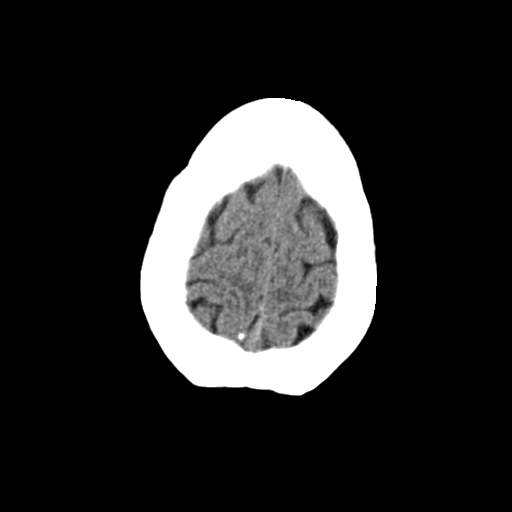
[im 28/34  bone]
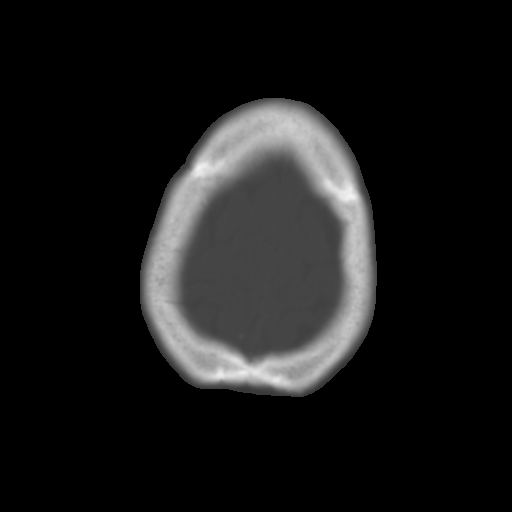
[im 31/34  brain]
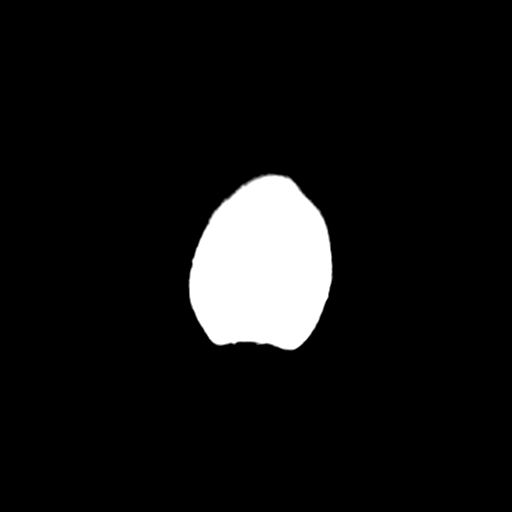

[Series 4: coronal soft · coronal · 0.33mm/px · 3 of 67 slices shown]
[im 23/67  brain]
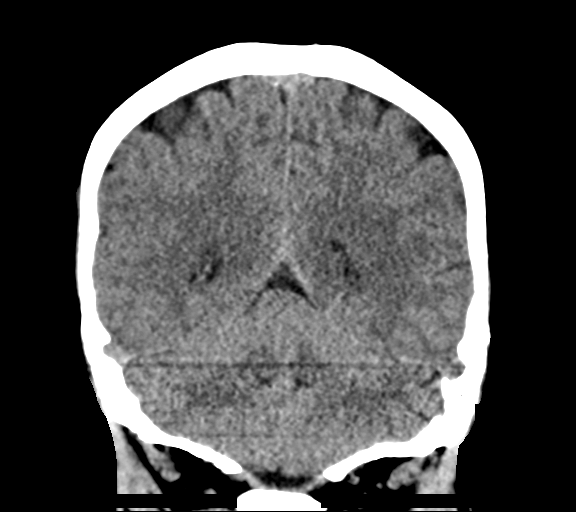
[im 30/67  brain]
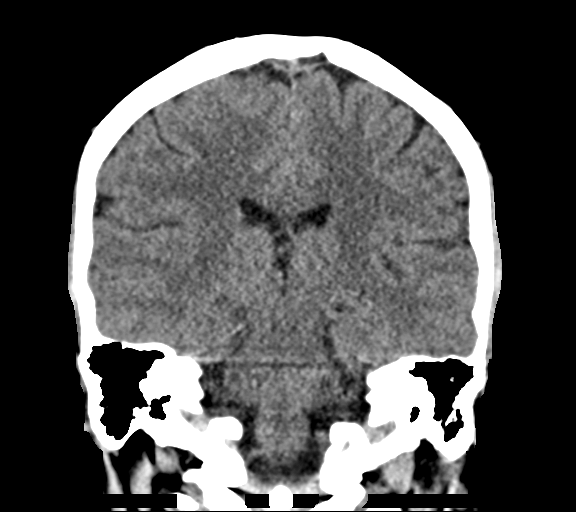
[im 37/67  brain]
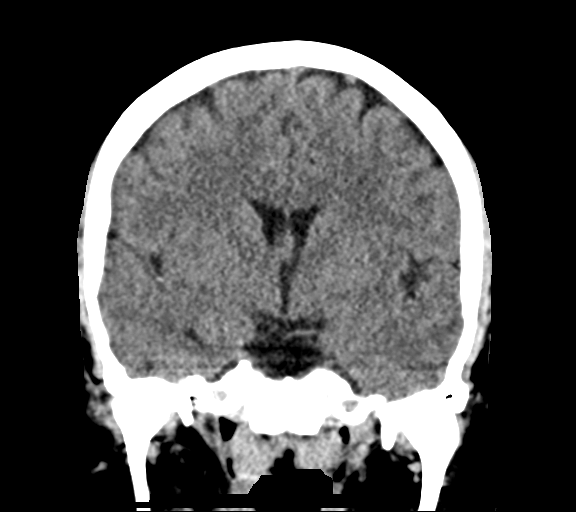

[Series 5: sag soft · sagittal · 0.33mm/px · 3 of 62 slices shown]
[im 21/62  brain]
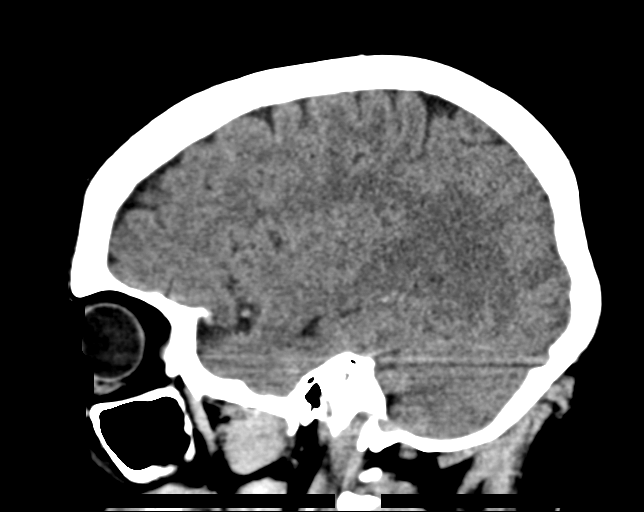
[im 31/62  brain]
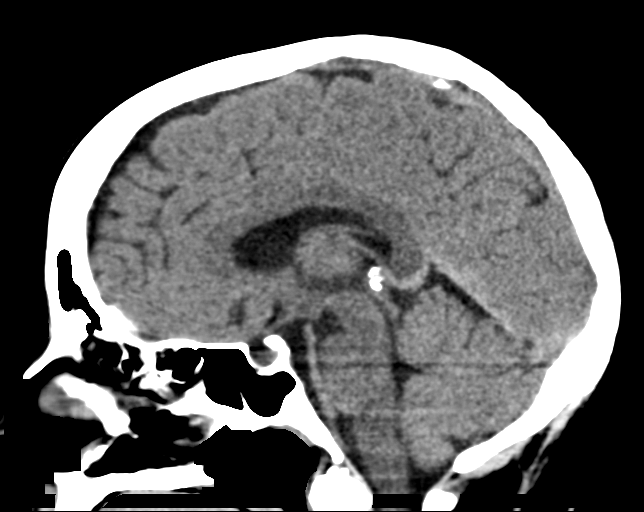
[im 41/62  brain]
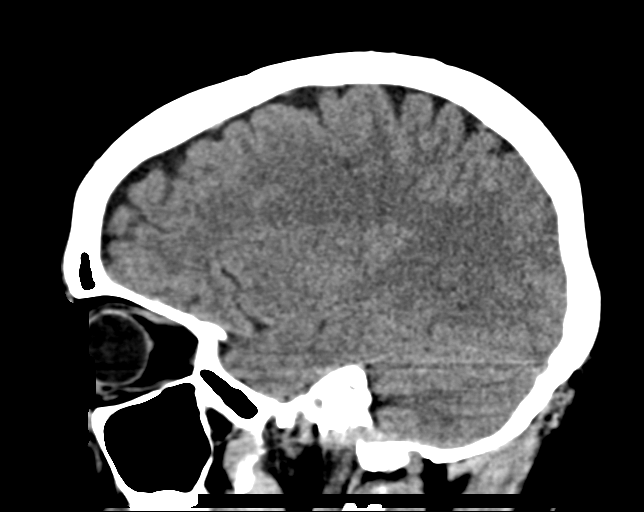

[16 of 47 positions shown; findings below may reference images not displayed]

FINDINGS: Brain: No acute intracranial abnormality. Specifically, no
hemorrhage, hydrocephalus, mass lesion, acute infarction, or
significant intracranial injury.

Vascular: No hyperdense vessel or unexpected calcification.

Skull: No acute calvarial abnormality.

Sinuses/Orbits: No acute findings

Other: None
IMPRESSION: No acute intracranial abnormality.

## 2023-07-18 IMAGING — US US ABDOMEN LIMITED
1 series · 14 of 25 positions shown · non-contrast
Comparison: CT 07/15/2013

CLINICAL DATA: Elevated LFT with fatigue

EXAM:
ULTRASOUND ABDOMEN LIMITED RIGHT UPPER QUADRANT

[Series 1: us abdomen limited · 14 of 80 slices shown]
[im 1/80]
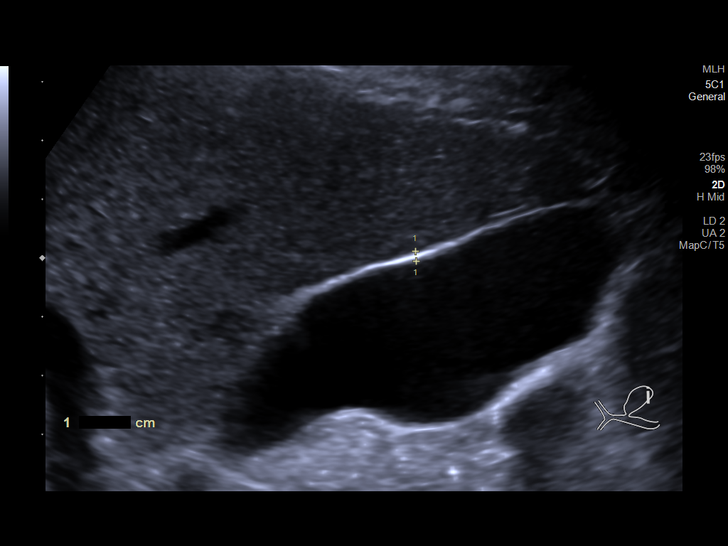
[im 7/80]
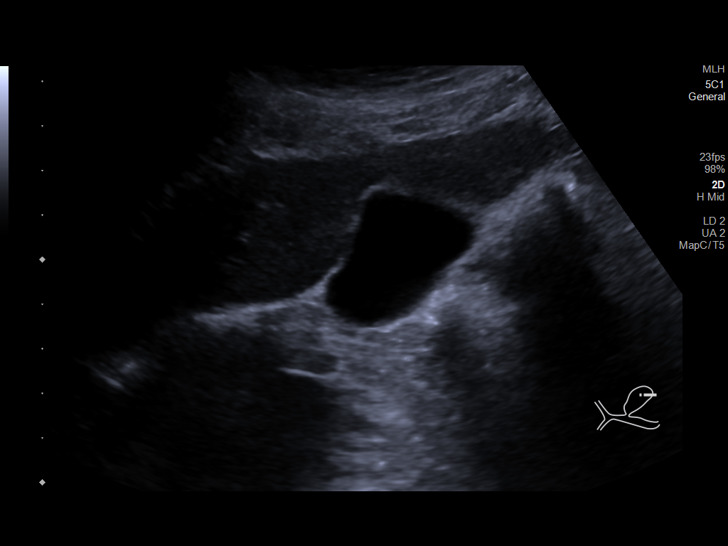
[im 14/80]
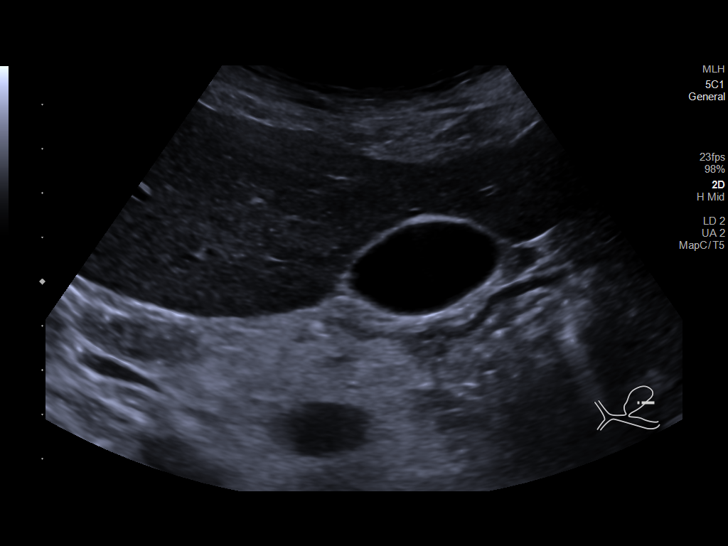
[im 20/80]
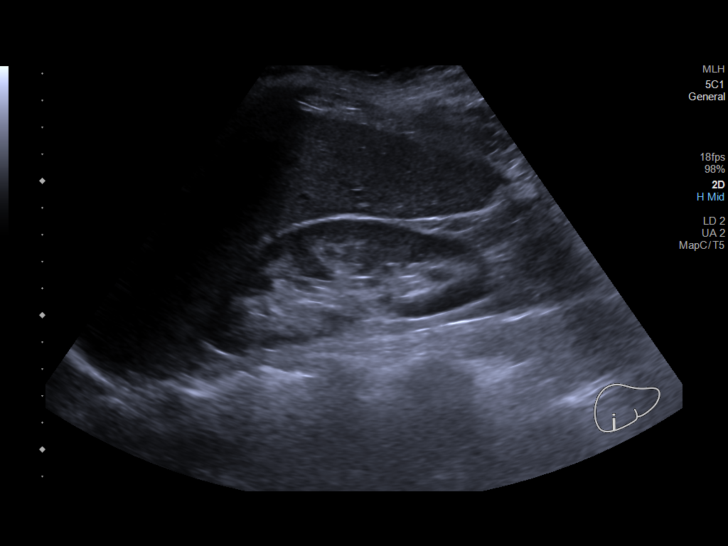
[im 27/80]
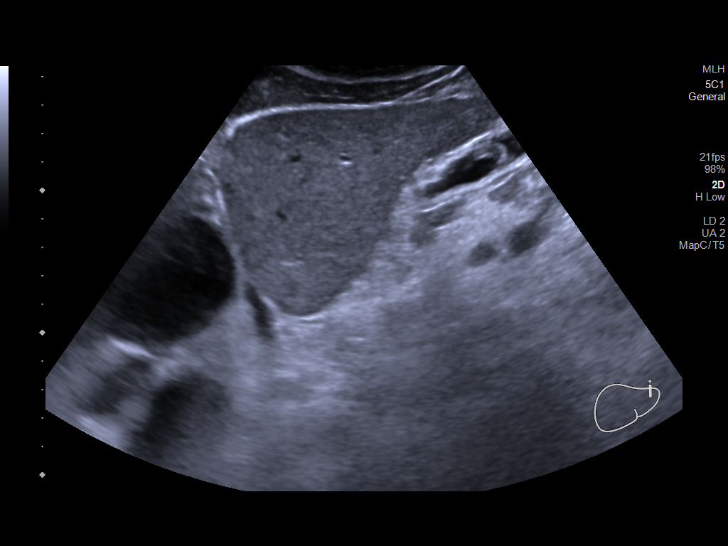
[im 30/80]
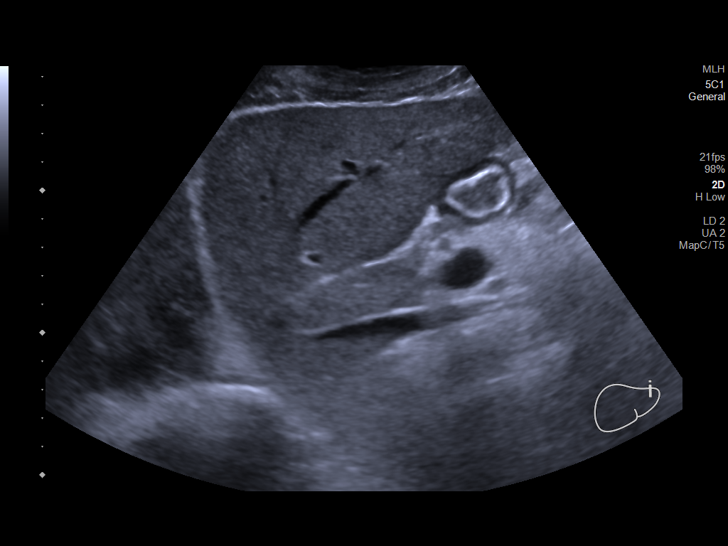
[im 37/80]
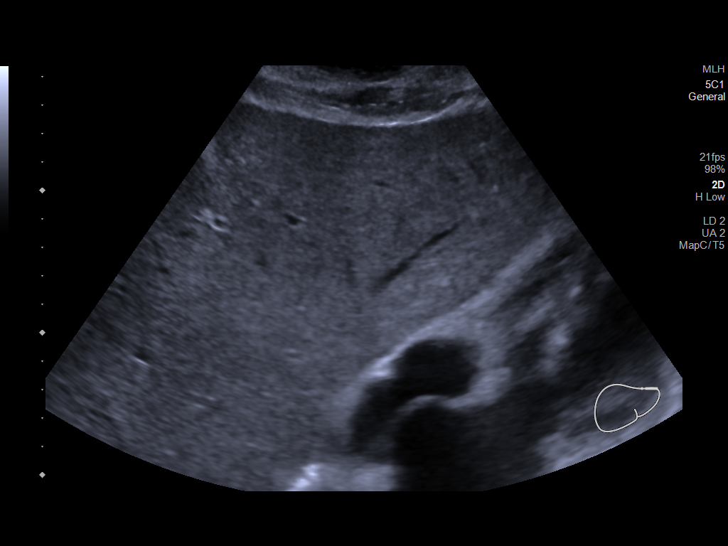
[im 43/80]
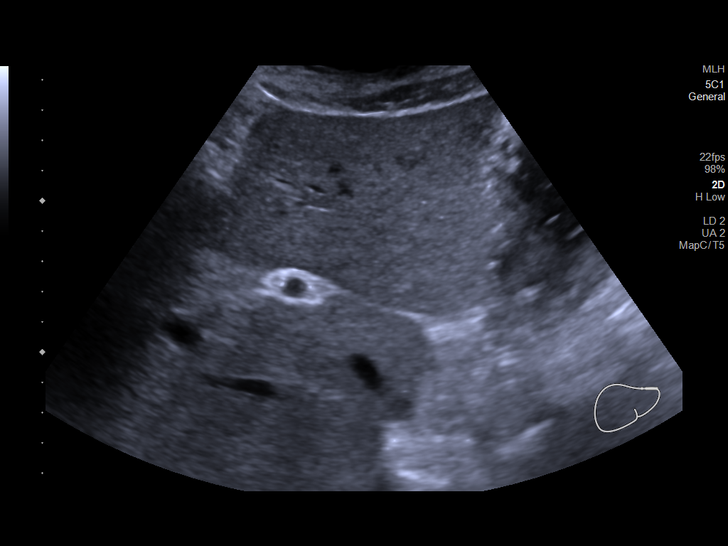
[im 50/80]
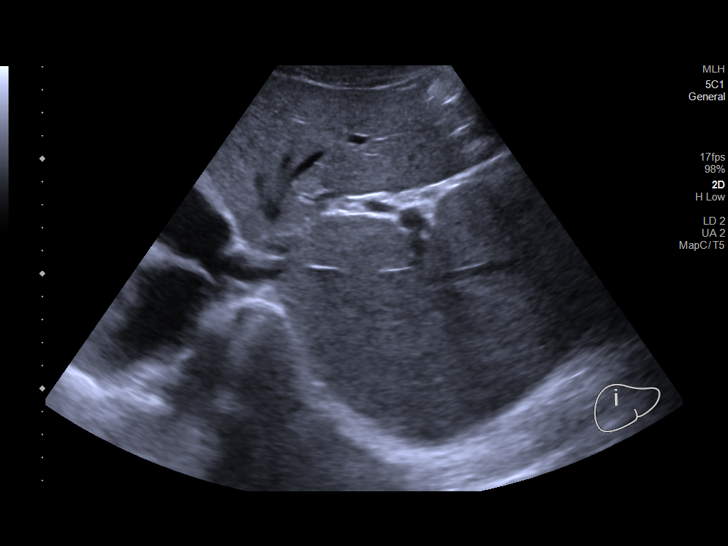
[im 53/80]
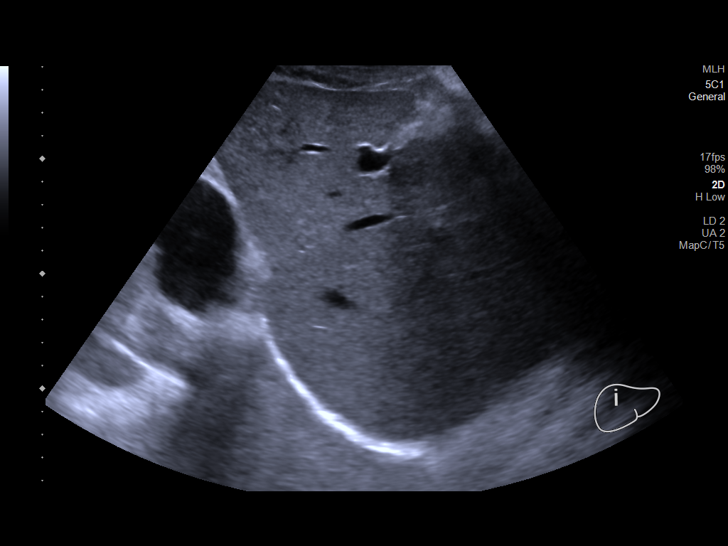
[im 60/80]
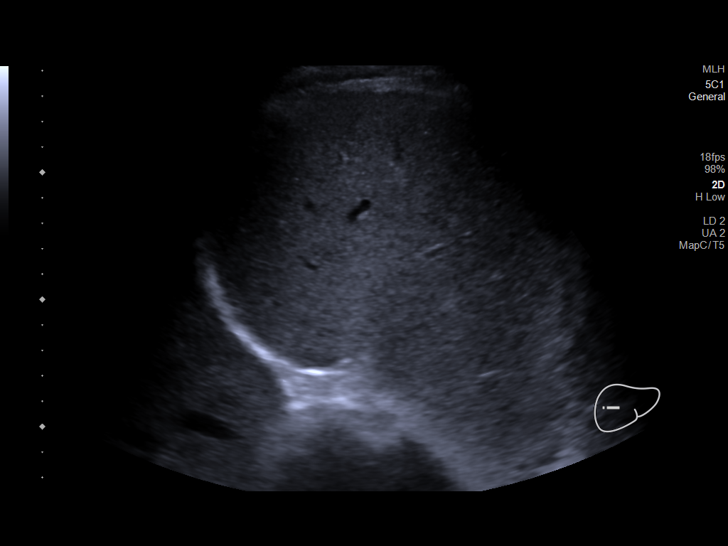
[im 66/80]
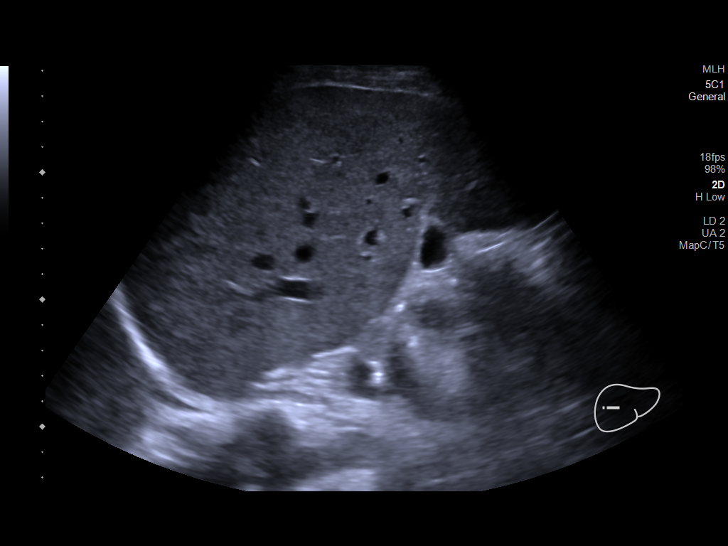
[im 73/80]
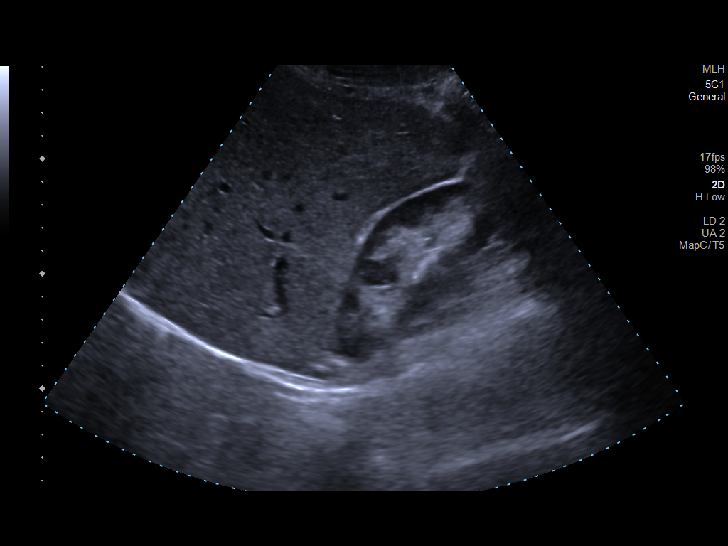
[im 80/80]
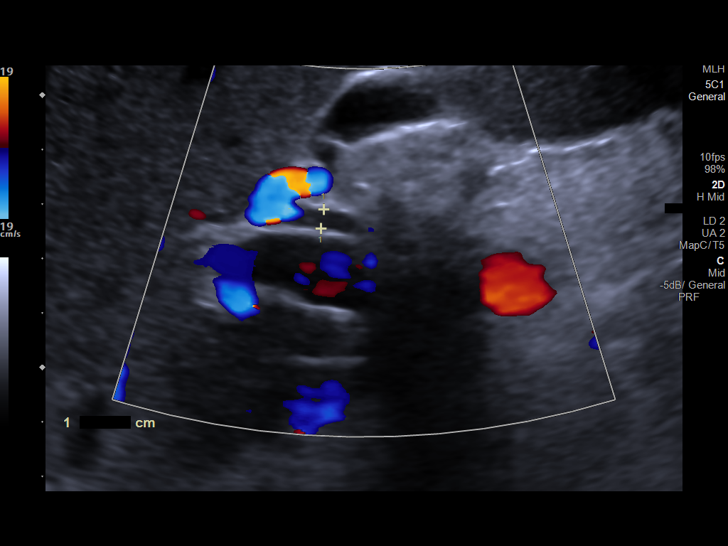

[14 of 25 positions shown; findings below may reference images not displayed]

FINDINGS: Gallbladder:

No gallstones or wall thickening visualized. No sonographic Murphy
sign noted by sonographer.

Common bile duct:

Diameter: 3.8 mm

Liver:

No focal lesion identified. Within normal limits in parenchymal
echogenicity. Portal vein is patent on color Doppler imaging with
normal direction of blood flow towards the liver.

Other: None.
IMPRESSION: Negative examination

## 2023-07-26 DIAGNOSIS — M9905 Segmental and somatic dysfunction of pelvic region: Secondary | ICD-10-CM | POA: Diagnosis not present

## 2023-07-26 DIAGNOSIS — M9903 Segmental and somatic dysfunction of lumbar region: Secondary | ICD-10-CM | POA: Diagnosis not present

## 2023-07-26 DIAGNOSIS — M9902 Segmental and somatic dysfunction of thoracic region: Secondary | ICD-10-CM | POA: Diagnosis not present

## 2023-07-26 DIAGNOSIS — M9901 Segmental and somatic dysfunction of cervical region: Secondary | ICD-10-CM | POA: Diagnosis not present

## 2023-07-29 DIAGNOSIS — M9901 Segmental and somatic dysfunction of cervical region: Secondary | ICD-10-CM | POA: Diagnosis not present

## 2023-07-29 DIAGNOSIS — M9902 Segmental and somatic dysfunction of thoracic region: Secondary | ICD-10-CM | POA: Diagnosis not present

## 2023-07-29 DIAGNOSIS — M9905 Segmental and somatic dysfunction of pelvic region: Secondary | ICD-10-CM | POA: Diagnosis not present

## 2023-07-29 DIAGNOSIS — M9903 Segmental and somatic dysfunction of lumbar region: Secondary | ICD-10-CM | POA: Diagnosis not present

## 2023-07-31 DIAGNOSIS — M9905 Segmental and somatic dysfunction of pelvic region: Secondary | ICD-10-CM | POA: Diagnosis not present

## 2023-07-31 DIAGNOSIS — M9902 Segmental and somatic dysfunction of thoracic region: Secondary | ICD-10-CM | POA: Diagnosis not present

## 2023-07-31 DIAGNOSIS — M9903 Segmental and somatic dysfunction of lumbar region: Secondary | ICD-10-CM | POA: Diagnosis not present

## 2023-07-31 DIAGNOSIS — M9901 Segmental and somatic dysfunction of cervical region: Secondary | ICD-10-CM | POA: Diagnosis not present

## 2023-08-01 DIAGNOSIS — M9902 Segmental and somatic dysfunction of thoracic region: Secondary | ICD-10-CM | POA: Diagnosis not present

## 2023-08-01 DIAGNOSIS — M9903 Segmental and somatic dysfunction of lumbar region: Secondary | ICD-10-CM | POA: Diagnosis not present

## 2023-08-01 DIAGNOSIS — M9905 Segmental and somatic dysfunction of pelvic region: Secondary | ICD-10-CM | POA: Diagnosis not present

## 2023-08-01 DIAGNOSIS — M9901 Segmental and somatic dysfunction of cervical region: Secondary | ICD-10-CM | POA: Diagnosis not present

## 2023-08-05 DIAGNOSIS — M9903 Segmental and somatic dysfunction of lumbar region: Secondary | ICD-10-CM | POA: Diagnosis not present

## 2023-08-05 DIAGNOSIS — M9905 Segmental and somatic dysfunction of pelvic region: Secondary | ICD-10-CM | POA: Diagnosis not present

## 2023-08-05 DIAGNOSIS — M9901 Segmental and somatic dysfunction of cervical region: Secondary | ICD-10-CM | POA: Diagnosis not present

## 2023-08-05 DIAGNOSIS — M9902 Segmental and somatic dysfunction of thoracic region: Secondary | ICD-10-CM | POA: Diagnosis not present

## 2023-08-07 DIAGNOSIS — M9902 Segmental and somatic dysfunction of thoracic region: Secondary | ICD-10-CM | POA: Diagnosis not present

## 2023-08-07 DIAGNOSIS — M9903 Segmental and somatic dysfunction of lumbar region: Secondary | ICD-10-CM | POA: Diagnosis not present

## 2023-08-07 DIAGNOSIS — M9901 Segmental and somatic dysfunction of cervical region: Secondary | ICD-10-CM | POA: Diagnosis not present

## 2023-08-07 DIAGNOSIS — M9905 Segmental and somatic dysfunction of pelvic region: Secondary | ICD-10-CM | POA: Diagnosis not present

## 2023-08-12 DIAGNOSIS — M9903 Segmental and somatic dysfunction of lumbar region: Secondary | ICD-10-CM | POA: Diagnosis not present

## 2023-08-12 DIAGNOSIS — M9905 Segmental and somatic dysfunction of pelvic region: Secondary | ICD-10-CM | POA: Diagnosis not present

## 2023-08-12 DIAGNOSIS — M9902 Segmental and somatic dysfunction of thoracic region: Secondary | ICD-10-CM | POA: Diagnosis not present

## 2023-08-12 DIAGNOSIS — M9901 Segmental and somatic dysfunction of cervical region: Secondary | ICD-10-CM | POA: Diagnosis not present

## 2023-08-14 DIAGNOSIS — M9902 Segmental and somatic dysfunction of thoracic region: Secondary | ICD-10-CM | POA: Diagnosis not present

## 2023-08-14 DIAGNOSIS — M9901 Segmental and somatic dysfunction of cervical region: Secondary | ICD-10-CM | POA: Diagnosis not present

## 2023-08-14 DIAGNOSIS — M9905 Segmental and somatic dysfunction of pelvic region: Secondary | ICD-10-CM | POA: Diagnosis not present

## 2023-08-14 DIAGNOSIS — M9903 Segmental and somatic dysfunction of lumbar region: Secondary | ICD-10-CM | POA: Diagnosis not present

## 2023-08-19 DIAGNOSIS — M9903 Segmental and somatic dysfunction of lumbar region: Secondary | ICD-10-CM | POA: Diagnosis not present

## 2023-08-19 DIAGNOSIS — M9905 Segmental and somatic dysfunction of pelvic region: Secondary | ICD-10-CM | POA: Diagnosis not present

## 2023-08-19 DIAGNOSIS — M9902 Segmental and somatic dysfunction of thoracic region: Secondary | ICD-10-CM | POA: Diagnosis not present

## 2023-08-19 DIAGNOSIS — M9901 Segmental and somatic dysfunction of cervical region: Secondary | ICD-10-CM | POA: Diagnosis not present

## 2023-08-21 DIAGNOSIS — M9901 Segmental and somatic dysfunction of cervical region: Secondary | ICD-10-CM | POA: Diagnosis not present

## 2023-08-21 DIAGNOSIS — M9902 Segmental and somatic dysfunction of thoracic region: Secondary | ICD-10-CM | POA: Diagnosis not present

## 2023-08-21 DIAGNOSIS — M9903 Segmental and somatic dysfunction of lumbar region: Secondary | ICD-10-CM | POA: Diagnosis not present

## 2023-08-21 DIAGNOSIS — M9905 Segmental and somatic dysfunction of pelvic region: Secondary | ICD-10-CM | POA: Diagnosis not present

## 2023-08-22 DIAGNOSIS — M9905 Segmental and somatic dysfunction of pelvic region: Secondary | ICD-10-CM | POA: Diagnosis not present

## 2023-08-22 DIAGNOSIS — M9903 Segmental and somatic dysfunction of lumbar region: Secondary | ICD-10-CM | POA: Diagnosis not present

## 2023-08-22 DIAGNOSIS — M9901 Segmental and somatic dysfunction of cervical region: Secondary | ICD-10-CM | POA: Diagnosis not present

## 2023-08-22 DIAGNOSIS — M9902 Segmental and somatic dysfunction of thoracic region: Secondary | ICD-10-CM | POA: Diagnosis not present

## 2023-08-29 DIAGNOSIS — M9902 Segmental and somatic dysfunction of thoracic region: Secondary | ICD-10-CM | POA: Diagnosis not present

## 2023-08-29 DIAGNOSIS — M9905 Segmental and somatic dysfunction of pelvic region: Secondary | ICD-10-CM | POA: Diagnosis not present

## 2023-08-29 DIAGNOSIS — M9901 Segmental and somatic dysfunction of cervical region: Secondary | ICD-10-CM | POA: Diagnosis not present

## 2023-08-29 DIAGNOSIS — M9903 Segmental and somatic dysfunction of lumbar region: Secondary | ICD-10-CM | POA: Diagnosis not present

## 2023-08-30 DIAGNOSIS — M9902 Segmental and somatic dysfunction of thoracic region: Secondary | ICD-10-CM | POA: Diagnosis not present

## 2023-08-30 DIAGNOSIS — M9905 Segmental and somatic dysfunction of pelvic region: Secondary | ICD-10-CM | POA: Diagnosis not present

## 2023-08-30 DIAGNOSIS — M9903 Segmental and somatic dysfunction of lumbar region: Secondary | ICD-10-CM | POA: Diagnosis not present

## 2023-08-30 DIAGNOSIS — M9901 Segmental and somatic dysfunction of cervical region: Secondary | ICD-10-CM | POA: Diagnosis not present

## 2023-09-02 DIAGNOSIS — M9903 Segmental and somatic dysfunction of lumbar region: Secondary | ICD-10-CM | POA: Diagnosis not present

## 2023-09-02 DIAGNOSIS — M9902 Segmental and somatic dysfunction of thoracic region: Secondary | ICD-10-CM | POA: Diagnosis not present

## 2023-09-02 DIAGNOSIS — M9901 Segmental and somatic dysfunction of cervical region: Secondary | ICD-10-CM | POA: Diagnosis not present

## 2023-09-02 DIAGNOSIS — M9905 Segmental and somatic dysfunction of pelvic region: Secondary | ICD-10-CM | POA: Diagnosis not present

## 2023-09-04 DIAGNOSIS — M9901 Segmental and somatic dysfunction of cervical region: Secondary | ICD-10-CM | POA: Diagnosis not present

## 2023-09-04 DIAGNOSIS — M9902 Segmental and somatic dysfunction of thoracic region: Secondary | ICD-10-CM | POA: Diagnosis not present

## 2023-09-04 DIAGNOSIS — M9903 Segmental and somatic dysfunction of lumbar region: Secondary | ICD-10-CM | POA: Diagnosis not present

## 2023-09-04 DIAGNOSIS — M9905 Segmental and somatic dysfunction of pelvic region: Secondary | ICD-10-CM | POA: Diagnosis not present

## 2023-09-09 DIAGNOSIS — M9901 Segmental and somatic dysfunction of cervical region: Secondary | ICD-10-CM | POA: Diagnosis not present

## 2023-09-09 DIAGNOSIS — M9902 Segmental and somatic dysfunction of thoracic region: Secondary | ICD-10-CM | POA: Diagnosis not present

## 2023-09-09 DIAGNOSIS — M9903 Segmental and somatic dysfunction of lumbar region: Secondary | ICD-10-CM | POA: Diagnosis not present

## 2023-09-09 DIAGNOSIS — M9905 Segmental and somatic dysfunction of pelvic region: Secondary | ICD-10-CM | POA: Diagnosis not present

## 2023-09-11 DIAGNOSIS — M9902 Segmental and somatic dysfunction of thoracic region: Secondary | ICD-10-CM | POA: Diagnosis not present

## 2023-09-11 DIAGNOSIS — M9903 Segmental and somatic dysfunction of lumbar region: Secondary | ICD-10-CM | POA: Diagnosis not present

## 2023-09-11 DIAGNOSIS — M9901 Segmental and somatic dysfunction of cervical region: Secondary | ICD-10-CM | POA: Diagnosis not present

## 2023-09-11 DIAGNOSIS — M9905 Segmental and somatic dysfunction of pelvic region: Secondary | ICD-10-CM | POA: Diagnosis not present

## 2023-09-16 DIAGNOSIS — M9902 Segmental and somatic dysfunction of thoracic region: Secondary | ICD-10-CM | POA: Diagnosis not present

## 2023-09-16 DIAGNOSIS — M9903 Segmental and somatic dysfunction of lumbar region: Secondary | ICD-10-CM | POA: Diagnosis not present

## 2023-09-16 DIAGNOSIS — M9905 Segmental and somatic dysfunction of pelvic region: Secondary | ICD-10-CM | POA: Diagnosis not present

## 2023-09-16 DIAGNOSIS — M9901 Segmental and somatic dysfunction of cervical region: Secondary | ICD-10-CM | POA: Diagnosis not present

## 2023-09-18 DIAGNOSIS — M9902 Segmental and somatic dysfunction of thoracic region: Secondary | ICD-10-CM | POA: Diagnosis not present

## 2023-09-18 DIAGNOSIS — M9905 Segmental and somatic dysfunction of pelvic region: Secondary | ICD-10-CM | POA: Diagnosis not present

## 2023-09-18 DIAGNOSIS — M9903 Segmental and somatic dysfunction of lumbar region: Secondary | ICD-10-CM | POA: Diagnosis not present

## 2023-09-18 DIAGNOSIS — M9901 Segmental and somatic dysfunction of cervical region: Secondary | ICD-10-CM | POA: Diagnosis not present

## 2023-09-23 DIAGNOSIS — M9903 Segmental and somatic dysfunction of lumbar region: Secondary | ICD-10-CM | POA: Diagnosis not present

## 2023-09-23 DIAGNOSIS — M9902 Segmental and somatic dysfunction of thoracic region: Secondary | ICD-10-CM | POA: Diagnosis not present

## 2023-09-23 DIAGNOSIS — M9901 Segmental and somatic dysfunction of cervical region: Secondary | ICD-10-CM | POA: Diagnosis not present

## 2023-09-23 DIAGNOSIS — M9905 Segmental and somatic dysfunction of pelvic region: Secondary | ICD-10-CM | POA: Diagnosis not present

## 2023-09-25 DIAGNOSIS — M9902 Segmental and somatic dysfunction of thoracic region: Secondary | ICD-10-CM | POA: Diagnosis not present

## 2023-09-25 DIAGNOSIS — M9903 Segmental and somatic dysfunction of lumbar region: Secondary | ICD-10-CM | POA: Diagnosis not present

## 2023-09-25 DIAGNOSIS — M9905 Segmental and somatic dysfunction of pelvic region: Secondary | ICD-10-CM | POA: Diagnosis not present

## 2023-09-25 DIAGNOSIS — M9901 Segmental and somatic dysfunction of cervical region: Secondary | ICD-10-CM | POA: Diagnosis not present

## 2023-09-30 DIAGNOSIS — M9905 Segmental and somatic dysfunction of pelvic region: Secondary | ICD-10-CM | POA: Diagnosis not present

## 2023-09-30 DIAGNOSIS — M9901 Segmental and somatic dysfunction of cervical region: Secondary | ICD-10-CM | POA: Diagnosis not present

## 2023-09-30 DIAGNOSIS — M9903 Segmental and somatic dysfunction of lumbar region: Secondary | ICD-10-CM | POA: Diagnosis not present

## 2023-09-30 DIAGNOSIS — M9902 Segmental and somatic dysfunction of thoracic region: Secondary | ICD-10-CM | POA: Diagnosis not present

## 2023-10-02 DIAGNOSIS — M9901 Segmental and somatic dysfunction of cervical region: Secondary | ICD-10-CM | POA: Diagnosis not present

## 2023-10-02 DIAGNOSIS — M9903 Segmental and somatic dysfunction of lumbar region: Secondary | ICD-10-CM | POA: Diagnosis not present

## 2023-10-02 DIAGNOSIS — M9905 Segmental and somatic dysfunction of pelvic region: Secondary | ICD-10-CM | POA: Diagnosis not present

## 2023-10-02 DIAGNOSIS — M9902 Segmental and somatic dysfunction of thoracic region: Secondary | ICD-10-CM | POA: Diagnosis not present

## 2023-10-07 DIAGNOSIS — M9905 Segmental and somatic dysfunction of pelvic region: Secondary | ICD-10-CM | POA: Diagnosis not present

## 2023-10-07 DIAGNOSIS — M9902 Segmental and somatic dysfunction of thoracic region: Secondary | ICD-10-CM | POA: Diagnosis not present

## 2023-10-07 DIAGNOSIS — M9903 Segmental and somatic dysfunction of lumbar region: Secondary | ICD-10-CM | POA: Diagnosis not present

## 2023-10-07 DIAGNOSIS — M9901 Segmental and somatic dysfunction of cervical region: Secondary | ICD-10-CM | POA: Diagnosis not present

## 2023-10-09 DIAGNOSIS — M9903 Segmental and somatic dysfunction of lumbar region: Secondary | ICD-10-CM | POA: Diagnosis not present

## 2023-10-09 DIAGNOSIS — M9902 Segmental and somatic dysfunction of thoracic region: Secondary | ICD-10-CM | POA: Diagnosis not present

## 2023-10-09 DIAGNOSIS — M9905 Segmental and somatic dysfunction of pelvic region: Secondary | ICD-10-CM | POA: Diagnosis not present

## 2023-10-09 DIAGNOSIS — M9901 Segmental and somatic dysfunction of cervical region: Secondary | ICD-10-CM | POA: Diagnosis not present

## 2023-10-16 DIAGNOSIS — M9905 Segmental and somatic dysfunction of pelvic region: Secondary | ICD-10-CM | POA: Diagnosis not present

## 2023-10-16 DIAGNOSIS — M9903 Segmental and somatic dysfunction of lumbar region: Secondary | ICD-10-CM | POA: Diagnosis not present

## 2023-10-16 DIAGNOSIS — M9901 Segmental and somatic dysfunction of cervical region: Secondary | ICD-10-CM | POA: Diagnosis not present

## 2023-10-16 DIAGNOSIS — M9902 Segmental and somatic dysfunction of thoracic region: Secondary | ICD-10-CM | POA: Diagnosis not present

## 2023-10-18 DIAGNOSIS — M9905 Segmental and somatic dysfunction of pelvic region: Secondary | ICD-10-CM | POA: Diagnosis not present

## 2023-10-18 DIAGNOSIS — M9903 Segmental and somatic dysfunction of lumbar region: Secondary | ICD-10-CM | POA: Diagnosis not present

## 2023-10-18 DIAGNOSIS — M9901 Segmental and somatic dysfunction of cervical region: Secondary | ICD-10-CM | POA: Diagnosis not present

## 2023-10-18 DIAGNOSIS — M9902 Segmental and somatic dysfunction of thoracic region: Secondary | ICD-10-CM | POA: Diagnosis not present

## 2023-10-21 DIAGNOSIS — M9902 Segmental and somatic dysfunction of thoracic region: Secondary | ICD-10-CM | POA: Diagnosis not present

## 2023-10-21 DIAGNOSIS — M9903 Segmental and somatic dysfunction of lumbar region: Secondary | ICD-10-CM | POA: Diagnosis not present

## 2023-10-21 DIAGNOSIS — M9905 Segmental and somatic dysfunction of pelvic region: Secondary | ICD-10-CM | POA: Diagnosis not present

## 2023-10-21 DIAGNOSIS — M9901 Segmental and somatic dysfunction of cervical region: Secondary | ICD-10-CM | POA: Diagnosis not present

## 2023-10-29 DIAGNOSIS — M9902 Segmental and somatic dysfunction of thoracic region: Secondary | ICD-10-CM | POA: Diagnosis not present

## 2023-10-29 DIAGNOSIS — M9905 Segmental and somatic dysfunction of pelvic region: Secondary | ICD-10-CM | POA: Diagnosis not present

## 2023-10-29 DIAGNOSIS — M9903 Segmental and somatic dysfunction of lumbar region: Secondary | ICD-10-CM | POA: Diagnosis not present

## 2023-10-29 DIAGNOSIS — M9901 Segmental and somatic dysfunction of cervical region: Secondary | ICD-10-CM | POA: Diagnosis not present

## 2023-11-04 DIAGNOSIS — M9905 Segmental and somatic dysfunction of pelvic region: Secondary | ICD-10-CM | POA: Diagnosis not present

## 2023-11-04 DIAGNOSIS — M9902 Segmental and somatic dysfunction of thoracic region: Secondary | ICD-10-CM | POA: Diagnosis not present

## 2023-11-04 DIAGNOSIS — M9903 Segmental and somatic dysfunction of lumbar region: Secondary | ICD-10-CM | POA: Diagnosis not present

## 2023-11-04 DIAGNOSIS — M9901 Segmental and somatic dysfunction of cervical region: Secondary | ICD-10-CM | POA: Diagnosis not present

## 2024-09-24 DIAGNOSIS — Z135 Encounter for screening for eye and ear disorders: Secondary | ICD-10-CM | POA: Diagnosis not present

## 2024-09-24 DIAGNOSIS — H52222 Regular astigmatism, left eye: Secondary | ICD-10-CM | POA: Diagnosis not present
